# Patient Record
Sex: Female | Born: 1990 | Race: White | Hispanic: No | Smoking: Former smoker
Health system: Southern US, Community
[De-identification: ages and names within clinical notes are randomized; demographics above are authoritative.]

## PROBLEM LIST (undated history)

## (undated) ENCOUNTER — Inpatient Hospital Stay (HOSPITAL_COMMUNITY): Payer: Self-pay

## (undated) DIAGNOSIS — F329 Major depressive disorder, single episode, unspecified: Secondary | ICD-10-CM

## (undated) DIAGNOSIS — F32A Depression, unspecified: Secondary | ICD-10-CM

## (undated) DIAGNOSIS — Z789 Other specified health status: Secondary | ICD-10-CM

## (undated) HISTORY — DX: Depression, unspecified: F32.A

## (undated) HISTORY — DX: Major depressive disorder, single episode, unspecified: F32.9

## (undated) HISTORY — PX: NO PAST SURGERIES: SHX2092

---
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
## 2008-04-15 ENCOUNTER — Inpatient Hospital Stay (HOSPITAL_COMMUNITY): Admission: AD | Admit: 2008-04-15 | Discharge: 2008-04-16 | Payer: Self-pay | Admitting: Obstetrics and Gynecology

## 2008-05-01 ENCOUNTER — Inpatient Hospital Stay (HOSPITAL_COMMUNITY): Admission: AD | Admit: 2008-05-01 | Discharge: 2008-05-05 | Payer: Self-pay | Admitting: Obstetrics and Gynecology

## 2008-05-14 ENCOUNTER — Inpatient Hospital Stay (HOSPITAL_COMMUNITY): Admission: AD | Admit: 2008-05-14 | Discharge: 2008-05-16 | Payer: Self-pay | Admitting: Obstetrics and Gynecology

## 2009-08-14 ENCOUNTER — Emergency Department (HOSPITAL_COMMUNITY): Admission: EM | Admit: 2009-08-14 | Discharge: 2009-08-14 | Payer: Self-pay | Admitting: Emergency Medicine

## 2010-04-06 ENCOUNTER — Emergency Department (HOSPITAL_COMMUNITY): Admission: EM | Admit: 2010-04-06 | Discharge: 2010-04-06 | Payer: Self-pay | Admitting: Emergency Medicine

## 2010-05-09 ENCOUNTER — Inpatient Hospital Stay (HOSPITAL_COMMUNITY): Admission: AD | Admit: 2010-05-09 | Discharge: 2010-05-09 | Payer: Self-pay | Admitting: Obstetrics & Gynecology

## 2010-09-16 ENCOUNTER — Inpatient Hospital Stay (HOSPITAL_COMMUNITY): Admission: AD | Admit: 2010-09-16 | Discharge: 2010-09-16 | Payer: Self-pay | Admitting: Obstetrics & Gynecology

## 2010-09-16 ENCOUNTER — Ambulatory Visit: Payer: Self-pay | Admitting: Family Medicine

## 2010-09-17 ENCOUNTER — Ambulatory Visit: Payer: Self-pay | Admitting: Family Medicine

## 2010-09-17 ENCOUNTER — Inpatient Hospital Stay (HOSPITAL_COMMUNITY): Admission: AD | Admit: 2010-09-17 | Discharge: 2010-09-17 | Payer: Self-pay | Admitting: Obstetrics & Gynecology

## 2010-10-12 ENCOUNTER — Inpatient Hospital Stay (HOSPITAL_COMMUNITY)
Admission: AD | Admit: 2010-10-12 | Discharge: 2010-10-12 | Payer: Self-pay | Source: Home / Self Care | Admitting: Obstetrics & Gynecology

## 2010-10-15 ENCOUNTER — Ambulatory Visit: Payer: Self-pay | Admitting: Obstetrics & Gynecology

## 2010-10-16 ENCOUNTER — Ambulatory Visit: Payer: Self-pay | Admitting: Obstetrics and Gynecology

## 2010-10-16 ENCOUNTER — Encounter (INDEPENDENT_AMBULATORY_CARE_PROVIDER_SITE_OTHER): Payer: Self-pay | Admitting: *Deleted

## 2010-10-23 ENCOUNTER — Ambulatory Visit: Payer: Self-pay | Admitting: Obstetrics and Gynecology

## 2010-10-25 ENCOUNTER — Ambulatory Visit: Payer: Self-pay | Admitting: Obstetrics and Gynecology

## 2010-10-29 ENCOUNTER — Inpatient Hospital Stay (HOSPITAL_COMMUNITY)
Admission: RE | Admit: 2010-10-29 | Discharge: 2010-10-31 | Payer: Self-pay | Source: Home / Self Care | Admitting: Obstetrics & Gynecology

## 2010-11-27 ENCOUNTER — Ambulatory Visit: Payer: Self-pay | Admitting: Obstetrics and Gynecology

## 2010-12-04 ENCOUNTER — Ambulatory Visit: Payer: Self-pay | Admitting: Obstetrics and Gynecology

## 2011-02-16 ENCOUNTER — Emergency Department (HOSPITAL_COMMUNITY)
Admission: EM | Admit: 2011-02-16 | Discharge: 2011-02-16 | Disposition: A | Discharge status: Home / Self Care | Payer: Self-pay | Attending: Emergency Medicine | Admitting: Emergency Medicine

## 2011-02-16 DIAGNOSIS — G43909 Migraine, unspecified, not intractable, without status migrainosus: Secondary | ICD-10-CM | POA: Insufficient documentation

## 2011-02-16 LAB — URINE MICROSCOPIC-ADD ON

## 2011-02-16 LAB — URINALYSIS, ROUTINE W REFLEX MICROSCOPIC
Bilirubin Urine: NEGATIVE
Hgb urine dipstick: NEGATIVE
Ketones, ur: NEGATIVE mg/dL
Protein, ur: NEGATIVE mg/dL
Urobilinogen, UA: 0.2 mg/dL (ref 0.0–1.0)
pH: 5.5 (ref 5.0–8.0)

## 2011-02-18 LAB — POCT URINALYSIS DIPSTICK
Bilirubin Urine: NEGATIVE
Glucose, UA: NEGATIVE mg/dL
Hgb urine dipstick: NEGATIVE
Hgb urine dipstick: NEGATIVE
Ketones, ur: NEGATIVE mg/dL
Nitrite: NEGATIVE
Nitrite: NEGATIVE
Protein, ur: NEGATIVE mg/dL
Protein, ur: NEGATIVE mg/dL
Specific Gravity, Urine: 1.02 (ref 1.005–1.030)
Urobilinogen, UA: 0.2 mg/dL (ref 0.0–1.0)
Urobilinogen, UA: 0.2 mg/dL (ref 0.0–1.0)

## 2011-02-18 LAB — CBC
Hemoglobin: 11.9 g/dL — ABNORMAL LOW (ref 12.0–15.0)
MCV: 96.8 fL (ref 78.0–100.0)
RBC: 3.62 MIL/uL — ABNORMAL LOW (ref 3.87–5.11)
RDW: 14 % (ref 11.5–15.5)
WBC: 7.1 10*3/uL (ref 4.0–10.5)

## 2011-02-18 LAB — GLUCOSE, CAPILLARY: Glucose-Capillary: 69 mg/dL — ABNORMAL LOW (ref 70–99)

## 2011-02-18 LAB — URINALYSIS, ROUTINE W REFLEX MICROSCOPIC
Glucose, UA: 250 mg/dL — AB
Hgb urine dipstick: NEGATIVE
Protein, ur: NEGATIVE mg/dL

## 2011-02-20 LAB — WET PREP, GENITAL
Trich, Wet Prep: NONE SEEN
Yeast Wet Prep HPF POC: NONE SEEN

## 2011-02-20 LAB — GC/CHLAMYDIA PROBE AMP, GENITAL
Chlamydia, DNA Probe: NEGATIVE
GC Probe Amp, Genital: NEGATIVE

## 2011-02-20 LAB — URINALYSIS, ROUTINE W REFLEX MICROSCOPIC
Bilirubin Urine: NEGATIVE
Glucose, UA: NEGATIVE mg/dL
Hgb urine dipstick: NEGATIVE
Ketones, ur: NEGATIVE mg/dL
Specific Gravity, Urine: 1.015 (ref 1.005–1.030)

## 2011-02-20 LAB — DIFFERENTIAL
Basophils Absolute: 0 10*3/uL (ref 0.0–0.1)
Basophils Relative: 0 % (ref 0–1)
Eosinophils Absolute: 0 10*3/uL (ref 0.0–0.7)
Eosinophils Relative: 0 % (ref 0–5)
Neutro Abs: 4.5 10*3/uL (ref 1.7–7.7)

## 2011-02-20 LAB — STREP B DNA PROBE: Strep Group B Ag: NEGATIVE

## 2011-02-20 LAB — CBC: HCT: 33.1 % — ABNORMAL LOW (ref 36.0–46.0)

## 2011-02-20 LAB — RPR: RPR Ser Ql: NONREACTIVE

## 2011-02-20 LAB — HIV ANTIBODY (ROUTINE TESTING W REFLEX): HIV: NONREACTIVE

## 2011-02-24 LAB — URINALYSIS, ROUTINE W REFLEX MICROSCOPIC
Bilirubin Urine: NEGATIVE
Glucose, UA: NEGATIVE mg/dL
Ketones, ur: NEGATIVE mg/dL
Nitrite: NEGATIVE

## 2011-02-25 LAB — URINALYSIS, ROUTINE W REFLEX MICROSCOPIC
Bilirubin Urine: NEGATIVE
Glucose, UA: NEGATIVE mg/dL
Hgb urine dipstick: NEGATIVE
Ketones, ur: NEGATIVE mg/dL
Protein, ur: NEGATIVE mg/dL

## 2011-02-25 LAB — GC/CHLAMYDIA PROBE AMP, GENITAL
Chlamydia, DNA Probe: NEGATIVE
GC Probe Amp, Genital: NEGATIVE

## 2011-02-25 LAB — URINE MICROSCOPIC-ADD ON

## 2011-02-25 LAB — URINE CULTURE: Colony Count: >=100000

## 2011-02-25 LAB — WET PREP, GENITAL
Trich, Wet Prep: NONE SEEN
Yeast Wet Prep HPF POC: NONE SEEN

## 2011-04-22 NOTE — H&P (Signed)
NAME:  Carrie, Rogers NO.:  [id number]   MEDICAL RECORD NO.:  [id number]          PATIENT TYPE:  INP   LOCATION:  9164                          [facility]   PHYSICIAN:  Hal Morales, M.D.DATE OF BIRTH:  [birth year]   DATE OF ADMISSION:  05/01/2008  DATE OF DISCHARGE:                              HISTORY & PHYSICAL   HISTORY:  Carrie Rogers is a 21 year old gravida 1, para 33 and 2/7 weeks  who presented for induction secondary to post dates.  She reports  positive fetal movement.  Denies headache, visual symptoms or epigastric  pain.  Her pregnancy has been remarkable for  1. Group B strep negative.  2. Reports brother with multiple anomalies.  3. Late to care.  4. Irregular cycles.  5. Previous smoker.   PRENATAL LABS:  Blood type is O+, Rh antibody negative, VDRL  nonreactive, rubella titer positive, hepatitis B surface antigen  negative, HIV was nonreactive, toxoplasmosis titers were negative.  Pap  was normal.  GC and Chlamydia cultures were negative.  Hemoglobin upon  entering practice was 11.9.  It was 11.6 at 26 weeks.  The patient  presented too late for first trimester screen or quadruple screen.  A 1-  hour Glucola was elevated at 164, a 3-hour GTT was normal.  Group B  strep culture and other cultures were done at 36 weeks and were  negative.   HISTORY OF PRESENT PREGNANCY:  The patient entered care at approximately  22 weeks and 6 days.  EDC was uncertain, secondary to irregular cycles.  She had an earlier ultrasound with an EDC of Apr 22, 2008 she had  another at 32 weeks showing congruency of dating, weight at 85th  percentile, and normal AFI, placenta was posterior.  She had a Group B  strep cultures, GC, and Chlamydia cultures done at 36 weeks which were  negative.  She did have an elevated 1-hour Glucola of 164.  A 3-hour GTT  was normal.  She did plan [facility] for postpartum use.  A PUPPS rash  appeared at approximately 32 weeks, this  did resolve itself.  The rest  of her pregnancy was essentially uncomplicated.  On last exam she was  1+, 80% vertex at a minus one station.   OBSTETRICAL HISTORY:  The patient primigravida.   MEDICAL HISTORY:  1. She was previously on Ortho Tri-Cyclen and discontinued that in      September of 2008.  2. She reports the usual childhood illnesses.  3. She fractured her wrist at age 77.   SURGICAL HISTORY:  1. Includes wisdom teeth removed in 2008.  2. She was hospitalized for hypothermia at age 71.   ALLERGIES:  The patient has no known medication allergies.   FAMILY HISTORY:  Her father had an MI.  Her father had chronic  hypertension.  Paternal grandmother had varicosities.  Brothers and  cousins have asthma.  Her father is an insulin-dependent diabetic.  Her  father also had a stroke.  Paternal grandfather had lung cancer.  Maternal grandmother had breast cancer.  Cousins have depression.   GENETIC  HISTORY:  Remarkable for the patient's brother having multiple  deformities back and the hands.  The patient's mother had twin and  triplet pregnancy, but there was only 1 surviving on each.  Father of  baby's grandmother had twins.   SOCIAL HISTORY:  The patient is single.  The father of baby is not  currently present with her, today, but has been supportive.  Her parents  are with her today.  The patient has been a smoker 2-3 cigarettes per  day times 5-6 years.  She discontinued this in September of 2008.  Her  brother, sisters, aunts, and uncles are smokers.  Her brothers use  alcohol.  The patient has been followed by the certified nurse midwife  service in [place].  She denies any alcohol, drug, or  tobacco use during this pregnancy.  She is Caucasian.  She denies any  religious affiliation.  The father of baby's name is Alila Sotero.  The  patient has 2 years of college.  She is currently employed in [employer], and  is a Consulting civil engineer.  Her partner also has 3 years of  college.  He is also a  Consulting civil engineer.   PHYSICAL EXAM:  VITAL SIGNS: Stable.  The patient is febrile.  HEENT: Within normal limits.  LUNGS:  Breath sounds are clear.  HEART:  Regular rate and rhythm without murmur.  BREASTS:  Soft and nontender.  ABDOMEN:  Fundal height is approximately 39 cm. Estimated fetal weight  is 7-8 pounds.  Uterine contractions very occasional with some  irritability noted.  Fetal heart rate is reactive, initial baseline was  running 115-125; however, is now having reactive cycles with a somewhat  higher baseline in the 130s with much fetal activity.  CERVICAL EXAM:  Cervix is very posterior, a loose 1 cm, 80% vertex, at  minus one to a 0 station.  EXTREMITIES:  Deep tendon reflexes are 2+ without clonus.  There is  trace edema noted.   IMPRESSION:  1. Intrauterine pregnancy at 41-2/7 weeks.  2. Post dates.  3. Negative group B strep.   PLAN:  1. Admit to birthing suite with consult with Dr. Dierdre Forth as      attending physician.  2. Routine certified nurse midwife orders.  3. Will start with Pitocin induction then plan artificial rupture of      membranes as appropriate.  4. Patient will notify us regarding her desire for pain medication as      labor progresses.      Renaldo Reel Emilee Hero, C.N.M.      Hal Morales, M.D.  Electronically Signed    VLL/MEDQ  D:  05/01/2008  T:  05/01/2008  Job:  [id number]

## 2011-04-22 NOTE — H&P (Signed)
NAME:  CYRAH, MCLAMB NO.:  [id number]   MEDICAL RECORD NO.:  [id number]          PATIENT TYPE:  INP   LOCATION:  9306                          [facility]   PHYSICIAN:  Hal Morales, M.D.DATE OF BIRTH:  11/20/1991   DATE OF ADMISSION:  05/14/2008  DATE OF DISCHARGE:                              HISTORY & PHYSICAL   The patient is an 20 year old single white female, gravida 1, para 1,  status post a spontaneous vaginal delivery on May 03, 2008 (postpartum  day #11), who presents with chief complaint of fever and chills with  onset last night and malaise. Presented to primary care this evening and  sent directly here after a temperature there equal to 103 orally. T-max  at home per patient was 100.9 earlier in the day.  She complains of  slight nausea but no emesis, bowel movements normal, appetite decreased  this afternoon but normal intake at lunch. No UTI signs or symptoms or  PIH signs or symptoms. No cough or upper respiratory complaints.  She  does state, however, she has been winded when she has been walking  upstairs.  No redness or streaking on breast, cracking or blisters.  She reports good milk supply. No acute increase in vaginal bleeding.  She reports spotting recently, darker brown color. She last had Tylenol  this morning.  No medications since that time. She has a history of a 2-  day labor.  She was admitted on May 25 and delivered on May 27.  She  also has a history of postpartum hemorrhage after spontaneous delivery  of placenta.  Her T-max postpartum was 102.4, and she received three  doses of IV Unasyn while on mother baby.   PAST MEDICAL HISTORY:  She reports menarche at 35-16 years of age,  irregular cycles.  She reports use of Ortho-Cyclen in the past which she  had discontinued in September 2008. Smoking history:  She discontinued  in September 2008.  She previously smoked for 5-6 years, two to three  cigarettes per day. Fracture of  her wrist at age 45. Wisdom teeth  removal 2008. Hypothermia at age 37.   OBSTETRICAL HISTORY:  G1 with spontaneous vaginal delivery May 03, 2008,  of female by the name of Irving Burton.  She was 41-3/7 weeks. Baby weighed 9  pounds 2 ounces.  Apgars were 8/9. Significant for postpartum hemorrhage  for which she received Methalgen and Cytotec.  Has been breast feeding.  She had a second degree laceration which was repaired in normal fashion.  She did have some postpartum anemia.  Hemoglobin was 9.2 at discharge.  Patient without latex or medication allergies.   FAMILY HISTORY:  Father had a heart attack.  Father with high blood  pressure. Paternal grandmother varicosities. Brothers and some cousins  with asthma.  Father is an insulin-dependent diabetic. Father has had a  stroke.  Maternal grandfather lung cancer.  Maternal grandmother breast  cancer. Some cousins with depression. Brother, sisters, aunts, and  uncles nicotine addiction. Brothers alcohol.   SOCIAL HISTORY:  The patient is an 20 year old single white female.  No  religious affiliation. 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 She denied alcohol or illicit drug use during the  pregnancy and had discontinued cigarette use September 2008. Family is  very supportive.  She does live at home with family   OBJECTIVE:  VITAL SIGNS:  On admission, blood pressure 121/79, heart  rate 121, respirations 20, temperature 101.5 in triage.  Repeat  approximately well over an hour later and was 103.9 orally.   LABORATORY DATA:  CBC showed white count of 13.5.  Her hemoglobin was  11.9 which was 9.2 at discharge, hematocrit 34.3, platelets 303.  Her  differential of her CBC, neutrophils elevated equal to 90%., absolute  granulocytes 12.1.  UA showed large hemoglobin, 30 of protein, small  leukocytes.  Microscopic urine showed few bacteria, few epithelial  cells.   PHYSICAL EXAMINATION:  GENERAL:  She was in no  acute distress, alert and  oriented x3 and pleasant.  HEENT AND NECK:  No lymphedema. She does have glasses but otherwise  grossly intact.  Lips were slightly dry external but moist mucous  membranes.  SKIN:  Very warm but intact.  CARDIOVASCULAR: Tachycardic rate  but regular rhythm.  LUNGS:  Clear to auscultation bilaterally.  ABDOMEN:  Tender at umbilicus and lower quadrant.  She did grimace with  light palpation.  Uterus was -1.  No rebound or guarding.  PELVIC:  External inspection only. Laceration is healing well, well  approximated. No active lochia or vaginal bleeding with fundal pressure  and no edema external vulva.  EXTREMITIES:  Within normal limits and negative Homan's.   IMPRESSION:  1. Postpartum day #11 status post spontaneous vaginal delivery May 03, 2008, with a history of postpartum hemorrhage.  2. Probable endometritis.  3. Lactating   PLAN:  1. Consulted with Dr. Pennie Rushing regarding the patient's status, and plan      is to admit as inpatient for IV fluids and IV antibiotics.  2. She will receive clindamycin as well as gentamicin IV.  3. Prior to antibiotic initiation, blood cultures x2.  4 . She is to receive Tylenol 650 mg q.4 h p.o. x12 hours.  1. She is to have strict I's and O's.  2. Urine to be sent for culture and sensitivity.  3. Close observation for other signs and symptoms of infection and      temperature.      Candice [place], [place]      Hal Morales, M.D.  Electronically Signed    CHS/MEDQ  D:  05/14/2008  T:  05/14/2008  Job:  [id number]

## 2011-04-22 NOTE — Discharge Summary (Signed)
NAME:  Carrie Rogers, Carrie Rogers NO.:  [id number]   MEDICAL RECORD NO.:  [id number]          PATIENT TYPE:  INP   LOCATION:  9306                          [facility]   PHYSICIAN:  Hal Morales, M.D.DATE OF BIRTH:  04/30/1991   DATE OF ADMISSION:  05/14/2008  DATE OF DISCHARGE:  05/16/2008                               DISCHARGE SUMMARY   ADMISSION DIAGNOSES:  1. Status post vaginal delivery May 03, 2008, postpartum day #11.  2. Probable endometritis.  3. Breast-feeding.   DISCHARGE DIAGNOSES:  1. Status post vaginal delivery May 03, 2008, postpartum day #11.  2. Probable endometritis.  3. Breast-feeding.  4. Resolving endometritis.   HOSPITAL PROCEDURES:  IV antibiotics.   HOSPITAL COURSE:  The patient was admitted on May 14, 2008, with fever,  chills, and malaise with a temperature of 103 degrees.  She had slight  nausea, but no emesis.  Appetite was decreased and abdomen was tender.  White blood cell count on admission was 13.5 with 90% neutrophils.  She  was admitted as an inpatient for IV fluids and IV antibiotics.  Blood  cultures were drawn.  She was given Tylenol for her fever and urine was  sent for culture and sensitivity.  She was treated with gentamicin and  clindamycin for antibiotic coverage.  On May 15, 2008, she was doing  better.  Pain was decreased.  Lochia was normal.  She was afebrile.  Hemoglobin was 10.3.  White count was down to 10.6 with 80% neutrophils.  On May 16, 2008, she was ready to go home.  She was afebrile for 24  hours.  Abdomen was soft and nontender.  Chest, clear to auscultation.  Heart, regular rate and rhythm.  Extremities within normal limits.  Blood cultures showed no growth in either culture and she was deemed to  receive full benefit of her hospital stay and was discharged home.   DISCHARGE MEDICATIONS:  1. Augmentin 875 mg every 12 hours x10 days.  2. Motrin as needed per previous prescription.   DISCHARGE LABS:   Blood cultures negative x2.  White blood cell count  10.6, hemoglobin 10.3, and platelets 249.  Urine within normal limits.  Creatinine 0.57.  Urine culture pending.   DISCHARGE INSTRUCTIONS:  Include monitoring for any resumption of fever  or increase in pain or bleeding.   DISCHARGE FOLLOWUP:  In 1 week at [place] and then in July of  her postpartum visit.   CONDITION ON DISCHARGE:  Good.      Marie L. Williams, C.N.M.      Hal Morales, M.D.  Electronically Signed    MLW/MEDQ  D:  05/16/2008  T:  05/17/2008  Job:  [id number]

## 2011-04-25 NOTE — Discharge Summary (Signed)
NAME:  Carrie Rogers, BEITZ NO.:  [id number]   MEDICAL RECORD NO.:  [id number]          PATIENT TYPE:  INP   LOCATION:  9145                          [facility]   PHYSICIAN:  Crist Fat. Rivard, M.D. DATE OF BIRTH:  February 17, 1991   DATE OF ADMISSION:  05/01/2008  DATE OF DISCHARGE:  05/05/2008                               DISCHARGE SUMMARY   ADMITTING DIAGNOSES:  1. Intrauterine pregnancy at 22 and 2/7th weeks.  2. Post dates.   DISCHARGE DIAGNOSES:  1. Intrauterine pregnancy at 22 and 4/7th weeks.  2. Immediate postpartum hemorrhage.  3. Chorioamnionitis.  4. Postpartum anemia.   PROCEDURES:  1. Normal spontaneous vaginal birth.  2. Repair of second-degree perineal laceration.  3. Epidural anesthesia.   HOSPITAL COURSE:  Ms. Carrie Rogers is an 20 year old gravida 1, para 0, at 22  and 2/7th weeks' who presented for induction on 05/01/2008 secondary to  post dates.  Her pregnancy had been remarkable:  1. Group B strep negative.  2. Reports brother with multiple anomalies.  3. Late to care.  4. Irregular cycles.  5. Previous smoker.   On admission, the patient was started on Pitocin.  She was on Pitocin  throughout the day on 05/25, cervix at that time was 1+, 80% vertex at -  1 to 0 station with cervix very posterior.  By 7 p.m. that night, she  had not changed her cervix.  Plan was made to discontinue the Pitocin.  Cytotec was used through the night.  By the next morning, cervix was  still posterior to 80% vertex at a -1 station with bulging bag of water,  artificial rupture of membranes was accomplished after that.  Pitocin  was begun by approximately 1 p.m. when no change in labor status  occurred.  She had an epidural placed by that evening, at that time she  was 3.5 cm 90% vertex at -1 station.  Intrauterine pressure catheter was  placed.  By 10:30, __________ were still inadequate, temperature was  99.2.  By 1:15, she was 9 cm completely effaced with vertex  at 0 station  with no urge to push.  She labor down through the night.  She had some  sporadic decelerations.  She began pushing at approximately 4:30 a.m.,  vertex was at +2.  She then progressed to 2 hours of pushing, vertex was  at 2+ to +3 with progression being made.  She then was able to progress  to delivery at 8:30 a.m., although she was offered a vacuum assistance  at 7 a.m. and then 7:40 a.m., but she declined at that time.  At 8:30  a.m., she delivered a viable female by the name of Carrie Rogers, weight 9  pounds 2 ounces with Apgars were 8 and 9.  Placenta was spontaneous  intact.  She did have increased bleeding after placental delivery  occurred.  She received Methergine and 1000 mcg of Cytotec.  The  bleeding was isolated to a deep sulcus area and clamp was placed on a  vessel.  Dr. Estanislado Pandy was called to assist with exploration and possible  repair, but  there was no bleeding of significance noted at her  evaluation.  She found no cervical laceration and no deep cervical tear.  Second-degree perineal was repaired in the usual fashion.  The patient  did receive some Stadol postpartum.  The vaginal mucous was very  friable, but then this did resolve itself.  The patient's temperature  just prior to delivery was 100.4, it did spike to 102.4 at 11 a.m.,  Unasyn 3 g IV piggyback q.6 h. x3 doses was initiated.  The patient then  had an uncomplicated recovery course.  She had no dizziness or syncope.  Her hemoglobin on day 1 was 9.2, hematocrit was 26.2, white blood cell  count was 10, and platelet count was 142.  She had a temperature maximum  of 100.8 at noon on 05/27.  She was working on breast-feeding.  She did  complete the Unasyn course.  She was placed on iron.   On 05/05/2008, postpartum day #2, she was doing well.  She was up ad  lib.  She was tolerating ambulation with no difficulty.  She was having  a normal amount of vaginal bleeding and her vital signs were stable.  She  had been afebrile for greater than 24 hours.  She was deemed to  receive full benefit of hospital stay and was discharged home.   DISCHARGE INSTRUCTIONS:  Per [facility] handout.   DISCHARGE MEDICATIONS:  1. Motrin 600 mg p.o. q.6 h. p.r.n. pain.  2. She declined Tylox.  3. The patient will use Feosol 1 p.o. daily.  4. Stool softener 1 p.o. daily.   FOLLOWUP:  Discharge followup will occur in 6 weeks at [facility].       Renaldo Reel Emilee Hero, C.N.M.      Crist Fat Rivard, M.D.  Electronically Signed    VLL/MEDQ  D:  08/08/2008  T:  08/08/2008  Job:  [id number]

## 2011-09-03 LAB — CBC
HCT: 26.2 — ABNORMAL LOW
HCT: 35.2 — ABNORMAL LOW
Hemoglobin: 12.3
MCHC: 35.1
MCV: 91.9
MCV: 92.5
Platelets: 142 — ABNORMAL LOW
Platelets: 185
RDW: 12.5

## 2011-09-04 LAB — CBC
HCT: 29.6 — ABNORMAL LOW
Hemoglobin: 10.3 — ABNORMAL LOW
MCHC: 34.8
MCHC: 34.8
MCV: 92.8
MCV: 93.4
Platelets: 303
RBC: 3.17 — ABNORMAL LOW
RBC: 3.7 — ABNORMAL LOW
WBC: 10.6 — ABNORMAL HIGH

## 2011-09-04 LAB — URINALYSIS, ROUTINE W REFLEX MICROSCOPIC
Bilirubin Urine: NEGATIVE
Glucose, UA: NEGATIVE
Ketones, ur: NEGATIVE
Nitrite: NEGATIVE
Protein, ur: 30 — AB

## 2011-09-04 LAB — CREATININE, SERUM: GFR calc Af Amer: >60

## 2011-09-04 LAB — URINE MICROSCOPIC-ADD ON

## 2011-09-04 LAB — DIFFERENTIAL
Basophils Absolute: 0
Basophils Relative: 0
Basophils Relative: 0
Eosinophils Absolute: 0.1
Lymphocytes Relative: 15
Monocytes Absolute: 0.4
Monocytes Relative: 3
Monocytes Relative: 4
Neutro Abs: 12.1 — ABNORMAL HIGH
Neutro Abs: 8.4 — ABNORMAL HIGH
Neutrophils Relative %: 80 — ABNORMAL HIGH
Neutrophils Relative %: 90 — ABNORMAL HIGH

## 2011-09-04 LAB — URINE CULTURE: Colony Count: >=100000

## 2011-09-04 LAB — CULTURE, BLOOD (ROUTINE X 2): Culture: NO GROWTH

## 2012-06-01 ENCOUNTER — Inpatient Hospital Stay (HOSPITAL_COMMUNITY)
Admission: AD | Admit: 2012-06-01 | Discharge: 2012-06-01 | Disposition: A | Discharge status: Home / Self Care | Payer: Self-pay | Source: Ambulatory Visit | Attending: Obstetrics & Gynecology | Admitting: Obstetrics & Gynecology

## 2012-06-01 ENCOUNTER — Inpatient Hospital Stay (HOSPITAL_COMMUNITY): Payer: Self-pay

## 2012-06-01 ENCOUNTER — Encounter (HOSPITAL_COMMUNITY): Payer: Self-pay | Admitting: Advanced Practice Midwife

## 2012-06-01 DIAGNOSIS — O99891 Other specified diseases and conditions complicating pregnancy: Secondary | ICD-10-CM | POA: Insufficient documentation

## 2012-06-01 DIAGNOSIS — Z331 Pregnant state, incidental: Secondary | ICD-10-CM

## 2012-06-01 DIAGNOSIS — R1032 Left lower quadrant pain: Secondary | ICD-10-CM | POA: Insufficient documentation

## 2012-06-01 DIAGNOSIS — Z3490 Encounter for supervision of normal pregnancy, unspecified, unspecified trimester: Secondary | ICD-10-CM

## 2012-06-01 HISTORY — DX: Other specified health status: Z78.9

## 2012-06-01 LAB — URINALYSIS, ROUTINE W REFLEX MICROSCOPIC
Hgb urine dipstick: NEGATIVE
Nitrite: NEGATIVE
Specific Gravity, Urine: >1.03 — ABNORMAL HIGH (ref 1.005–1.030)
Urobilinogen, UA: 0.2 mg/dL (ref 0.0–1.0)
pH: 6 (ref 5.0–8.0)

## 2012-06-01 LAB — WET PREP, GENITAL: Clue Cells Wet Prep HPF POC: NONE SEEN

## 2012-06-01 LAB — URINE MICROSCOPIC-ADD ON

## 2012-06-01 LAB — POCT PREGNANCY, URINE: Preg Test, Ur: POSITIVE — AB

## 2012-06-01 NOTE — Discharge Instructions (Signed)
ABCs of Pregnancy A Antepartum care is very important. Be sure you see your doctor and get prenatal care as soon as you think you are pregnant. At this time, you will be tested for infection, genetic abnormalities and potential problems with you and the pregnancy. This is the time to discuss diet, exercise, work, medications, labor, pain medication during labor and the possibility of a cesarean delivery. Ask any questions that may concern you. It is important to see your doctor regularly throughout your pregnancy. Avoid exposure to toxic substances and chemicals - such as cleaning solvents, lead and mercury, some insecticides, and paint. Pregnant women should avoid exposure to paint fumes, and fumes that cause you to feel ill, dizzy or faint. When possible, it is a good idea to have a pre-pregnancy consultation with your caregiver to begin some important recommendations your caregiver suggests such as, taking folic acid, exercising, quitting smoking, avoiding alcoholic beverages, etc. B Breastfeeding is the healthiest choice for both you and your baby. It has many nutritional benefits for the baby and health benefits for the mother. It also creates a very tight and loving bond between the baby and mother. Talk to your doctor, your family and friends, and your employer about how you choose to feed your baby and how they can support you in your decision. Not all birth defects can be prevented, but a woman can take actions that may increase her chance of having a healthy baby. Many birth defects happen very early in pregnancy, sometimes before a woman even knows she is pregnant. Birth defects or abnormalities of any child in your or the father's family should be discussed with your caregiver. Get a good support bra as your breast size changes. Wear it especially when you exercise and when nursing.  C Celebrate the news of your pregnancy with the your spouse/father and family. Childbirth classes are helpful to  take for you and the spouse/father because it helps to understand what happens during the pregnancy, labor and delivery. Cesarean delivery should be discussed with your doctor so you are prepared for that possibility. The pros and cons of circumcision if it is a boy, should be discussed with your pediatrician. Cigarette smoking during pregnancy can result in low birth weight babies. It has been associated with infertility, miscarriages, tubal pregnancies, infant death (mortality) and poor health (morbidity) in childhood. Additionally, cigarette smoking may cause long-term learning disabilities. If you smoke, you should try to quit before getting pregnant and not smoke during the pregnancy. Secondary smoke may also harm a mother and her developing baby. It is a good idea to ask people to stop smoking around you during your pregnancy and after the baby is born. Extra calcium is necessary when you are pregnant and is found in your prenatal vitamin, in dairy products, green leafy vegetables and in calcium supplements. D A healthy diet according to your current weight and height, along with vitamins and mineral supplements should be discussed with your caregiver. Domestic abuse or violence should be made known to your doctor right away to get the situation corrected. Drink more water when you exercise to keep hydrated. Discomfort of your back and legs usually develops and progresses from the middle of the second trimester through to delivery of the baby. This is because of the enlarging baby and uterus, which may also affect your balance. Do not take illegal drugs. Illegal drugs can seriously harm the baby and you. Drink extra fluids (water is best) throughout pregnancy to help   your body keep up with the increases in your blood volume. Drink at least 6 to 8 glasses of water, fruit juice, or milk each day. A good way to know you are drinking enough fluid is when your urine looks almost like clear water or is very light  yellow.  E Eat healthy to get the nutrients you and your unborn baby need. Your meals should include the five basic food groups. Exercise (30 minutes of light to moderate exercise a day) is important and encouraged during pregnancy, if there are no medical problems or problems with the pregnancy. Exercise that causes discomfort or dizziness should be stopped and reported to your caregiver. Emotions during pregnancy can change from being ecstatic to depression and should be understood by you, your partner and your family. F Fetal screening with ultrasound, amniocentesis and monitoring during pregnancy and labor is common and sometimes necessary. Take 400 micrograms of folic acid daily both before, when possible, and during the first few months of pregnancy to reduce the risk of birth defects of the brain and spine. All women who could possibly become pregnant should take a vitamin with folic acid, every day. It is also important to eat a healthy diet with fortified foods (enriched grain products, including cereals, rice, breads, and pastas) and foods with natural sources of folate (orange juice, green leafy vegetables, beans, peanuts, broccoli, asparagus, peas, and lentils). The father should be involved with all aspects of the pregnancy including, the prenatal care, childbirth classes, labor, delivery, and postpartum time. Fathers may also have emotional concerns about being a father, financial needs, and raising a family. G Genetic testing should be done appropriately. It is important to know your family and the father's history. If there have been problems with pregnancies or birth defects in your family, report these to your doctor. Also, genetic counselors can talk with you about the information you might need in making decisions about having a family. You can call a major medical center in your area for help in finding a board-certified genetic counselor. Genetic testing and counseling should be done  before pregnancy when possible, especially if there is a history of problems in the mother's or father's family. Certain ethnic backgrounds are more at risk for genetic defects. H Get familiar with the hospital where you will be having your baby. Get to know how long it takes to get there, the labor and delivery area, and the hospital procedures. Be sure your medical insurance is accepted there. Get your home ready for the baby including, clothes, the baby's room (when possible), furniture and car seat. Hand washing is important throughout the day, especially after handling raw meat and poultry, changing the baby's diaper or using the bathroom. This can help prevent the spread of many bacteria and viruses that cause infection. Your hair may become dry and thinner, but will return to normal a few weeks after the baby is born. Heartburn is a common problem that can be treated by taking antacids recommended by your caregiver, eating smaller meals 5 or 6 times a day, not drinking liquids when eating, drinking between meals and raising the head of your bed 2 to 3 inches. I Insurance to cover you, the baby, doctor and hospital should be reviewed so that you will be prepared to pay any costs not covered by your insurance plan. If you do not have medical insurance, there are usually clinics and services available for you in your community. Take 30 milligrams of iron during   your pregnancy as prescribed by your doctor to reduce the risk of low red blood cells (anemia) later in pregnancy. All women of childbearing age should eat a diet rich in iron. J There should be a joint effort for the mother, father and any other children to adapt to the pregnancy financially, emotionally, and psychologically during the pregnancy. Join a support group for moms-to-be. Or, join a class on parenting or childbirth. Have the family participate when possible. K Know your limits. Let your caregiver know if you experience any of the  following:   Pain of any kind.   Strong cramps.   You develop a lot of weight in a short period of time (5 pounds in 3 to 5 days).   Vaginal bleeding, leaking of amniotic fluid.   Headache, vision problems.   Dizziness, fainting, shortness of breath.   Chest pain.   Fever of 102 F (38.9 C) or higher.   Gush of clear fluid from your vagina.   Painful urination.   Domestic violence.   Irregular heartbeat (palpitations).   Rapid beating of the heart (tachycardia).   Constant feeling sick to your stomach (nauseous) and vomiting.   Trouble walking, fluid retention (edema).   Muscle weakness.   If your baby has decreased activity.   Persistent diarrhea.   Abnormal vaginal discharge.   Uterine contractions at 20-minute intervals.   Back pain that travels down your leg.  L Learn and practice that what you eat and drink should be in moderation and healthy for you and your baby. Legal drugs such as alcohol and caffeine are important issues for pregnant women. There is no safe amount of alcohol a woman can drink while pregnant. Fetal alcohol syndrome, a disorder characterized by growth retardation, facial abnormalities, and central nervous system dysfunction, is caused by a woman's use of alcohol during pregnancy. Caffeine, found in tea, coffee, soft drinks and chocolate, should also be limited. Be sure to read labels when trying to cut down on caffeine during pregnancy. More than 200 foods, beverages, and over-the-counter medications contain caffeine and have a high salt content! There are coffees and teas that do not contain caffeine. M Medical conditions such as diabetes, epilepsy, and high blood pressure should be treated and kept under control before pregnancy when possible, but especially during pregnancy. Ask your caregiver about any medications that may need to be changed or adjusted during pregnancy. If you are currently taking any medications, ask your caregiver if it  is safe to take them while you are pregnant or before getting pregnant when possible. Also, be sure to discuss any herbs or vitamins you are taking. They are medicines, too! Discuss with your doctor all medications, prescribed and over-the-counter, that you are taking. During your prenatal visit, discuss the medications your doctor may give you during labor and delivery. N Never be afraid to ask your doctor or caregiver questions about your health, the progress of the pregnancy, family problems, stressful situations, and recommendation for a pediatrician, if you do not have one. It is better to take all precautions and discuss any questions or concerns you may have during your office visits. It is a good idea to write down your questions before you visit the doctor. O Over-the-counter cough and cold remedies may contain alcohol or other ingredients that should be avoided during pregnancy. Ask your caregiver about prescription, herbs or over-the-counter medications that you are taking or may consider taking while pregnant.  P Physical activity during pregnancy can   benefit both you and your baby by lessening discomfort and fatigue, providing a sense of well-being, and increasing the likelihood of early recovery after delivery. Light to moderate exercise during pregnancy strengthens the belly (abdominal) and back muscles. This helps improve posture. Practicing yoga, walking, swimming, and cycling on a stationary bicycle are usually safe exercises for pregnant women. Avoid scuba diving, exercise at high altitudes (over 3000 feet), skiing, horseback riding, contact sports, etc. Always check with your doctor before beginning any kind of exercise, especially during pregnancy and especially if you did not exercise before getting pregnant. Q Queasiness, stomach upset and morning sickness are common during pregnancy. Eating a couple of crackers or dry toast before getting out of bed. Foods that you normally love may  make you feel sick to your stomach. You may need to substitute other nutritious foods. Eating 5 or 6 small meals a day instead of 3 large ones may make you feel better. Do not drink with your meals, drink between meals. Questions that you have should be written down and asked during your prenatal visits. R Read about and make plans to baby-proof your home. There are important tips for making your home a safer environment for your baby. Review the tips and make your home safer for you and your baby. Read food labels regarding calories, salt and fat content in the food. S Saunas, hot tubs, and steam rooms should be avoided while you are pregnant. Excessive high heat may be harmful during your pregnancy. Your caregiver will screen and examine you for sexually transmitted diseases and genetic disorders during your prenatal visits. Learn the signs of labor. Sexual relations while pregnant is safe unless there is a medical or pregnancy problem and your caregiver advises against it. T Traveling long distances should be avoided especially in the third trimester of your pregnancy. If you do have to travel out of state, be sure to take a copy of your medical records and medical insurance plan with you. You should not travel long distances without seeing your doctor first. Most airlines will not allow you to travel after 36 weeks of pregnancy. Toxoplasmosis is an infection caused by a parasite that can seriously harm an unborn baby. Avoid eating undercooked meat and handling cat litter. Be sure to wear gloves when gardening. Tingling of the hands and fingers is not unusual and is due to fluid retention. This will go away after the baby is born. U Womb (uterus) size increases during the first trimester. Your kidneys will begin to function more efficiently. This may cause you to feel the need to urinate more often. You may also leak urine when sneezing, coughing or laughing. This is due to the growing uterus pressing  against your bladder, which lies directly in front of and slightly under the uterus during the first few months of pregnancy. If you experience burning along with frequency of urination or bloody urine, be sure to tell your doctor. The size of your uterus in the third trimester may cause a problem with your balance. It is advisable to maintain good posture and avoid wearing high heels during this time. An ultrasound of your baby may be necessary during your pregnancy and is safe for you and your baby. V Vaccinations are an important concern for pregnant women. Get needed vaccines before pregnancy. Center for Disease Control (www.cdc.gov) has clear guidelines for the use of vaccines during pregnancy. Review the list, be sure to discuss it with your doctor. Prenatal vitamins are helpful   and healthy for you and the baby. Do not take extra vitamins except what is recommended. Taking too much of certain vitamins can cause overdose problems. Continuous vomiting should be reported to your caregiver. Varicose veins may appear especially if there is a family history of varicose veins. They should subside after the delivery of the baby. Support hose helps if there is leg discomfort. W Being overweight or underweight during pregnancy may cause problems. Try to get within 15 pounds of your ideal weight before pregnancy. Remember, pregnancy is not a time to be dieting! Do not stop eating or start skipping meals as your weight increases. Both you and your baby need the calories and nutrition you receive from a healthy diet. Be sure to consult with your doctor about your diet. There is a formula and diet plan available depending on whether you are overweight or underweight. Your caregiver or nutritionist can help and advise you if necessary. X Avoid X-rays. If you must have dental work or diagnostic tests, tell your dentist or physician that you are pregnant so that extra care can be taken. X-rays should only be taken when  the risks of not taking them outweigh the risk of taking them. If needed, only the minimum amount of radiation should be used. When X-rays are necessary, protective lead shields should be used to cover areas of the body that are not being X-rayed. Y Your baby loves you. Breastfeeding your baby creates a loving and very close bond between the two of you. Give your baby a healthy environment to live in while you are pregnant. Infants and children require constant care and guidance. Their health and safety should be carefully watched at all times. After the baby is born, rest or take a nap when the baby is sleeping. Z Get your ZZZs. Be sure to get plenty of rest. Resting on your side as often as possible, especially on your left side is advised. It provides the best circulation to your baby and helps reduce swelling. Try taking a nap for 30 to 45 minutes in the afternoon when possible. After the baby is born rest or take a nap when the baby is sleeping. Try elevating your feet for that amount of time when possible. It helps the circulation in your legs and helps reduce swelling.  Most information courtesy of the CDC. Document Released: 11/24/2005 Document Revised: 11/13/2011 Document Reviewed: 08/08/2009 [employer] Patient Information 2012 [employer]. 

## 2012-06-01 NOTE — MAU Provider Note (Signed)
History     CSN: [id number]  Arrival date and time: 06/01/12 2035   First Provider Initiated Contact with Patient 06/01/12 2207      Chief Complaint  Patient presents with  . Possible Pregnancy   HPI This is a 21 y.o. female who presents at early gestation for evaluation and "checkup".  States started having LLQ pain this afternoon, initially sharp then dull. No bleeding. LMP in March, but no + UPTs until this month.   OB History    Grav Para Term Preterm Abortions TAB SAB Ect Mult Living   2 2 2       2       Past Medical History  Diagnosis Date  . No pertinent past medical history     Past Surgical History  Procedure Date  . No past surgeries     Family History  Problem Relation Age of Onset  . Other Neg Hx     History  Substance Use Topics  . Smoking status: Former Smoker -- 0.2 packs/day  . Smokeless tobacco: Former Neurosurgeon    Quit date: 04/01/2012  . Alcohol Use: No    Allergies: No Known Allergies  Prescriptions prior to admission  Medication Sig Dispense Refill  . Prenatal Vit-Fe Fumarate-FA (PRENATAL MULTIVITAMIN) TABS Take 1 tablet by mouth at bedtime.        ROS As listed in HPI  Physical Exam   Last menstrual period 02/20/2012.  Physical Exam  Constitutional: She appears well-developed and well-nourished. No distress.  HENT:  Head: Normocephalic.  Cardiovascular: Normal rate.   Respiratory: Effort normal.  GI: Soft. She exhibits no distension and no mass. There is tenderness (mild over LLQ). There is no rebound and no guarding.  Genitourinary: Vagina normal and uterus normal. No vaginal discharge found.       Uterus nontender, about 8 wks size Adnexae nontender bilaterally  Cervix long and closed    MAU Course  Procedures  MDM Will check UA and [place]  >> US Ob Comp Less 14 Wks  06/01/2012  *RADIOLOGY REPORT*  Clinical Data: Left lower quadrant pain.  Uncertain dates. Estimated gestational age by LMP is 14 weeks 4 days.  No quantitative  beta HCG reported.  OBSTETRIC <14 WK ULTRASOUND  Technique:  Transabdominal ultrasound was performed for evaluation of the gestation as well as the maternal uterus and adnexal regions.  Comparison:  None.  Intrauterine gestational sac: A single intrauterine gestational sac is visualized with normal shape and appearance. Yolk sac: A yolk sac is visualized. Embryo: Fetal pole is visualized. Cardiac Activity: Fetal cardiac activity is visualized. Heart Rate: 172 bpm  CRL:  25 mm  9w  2d           [place] EDC: 01/02/2013  Maternal uterus/Adnexae: Myometrial mass lesions identified.  No subchorionic hemorrhage. The ovaries appear symmetrical.  No abnormal adnexal masses.  No free pelvic fluid collections.  IMPRESSION: A single intrauterine pregnancy.  Estimated gestational age by crown-rump length is 9 weeks 2 days.  Original Report Authenticated By: Marlon Pel, M.D.  Results for orders placed during the hospital encounter of 06/01/12 (from the past 24 hour(s))  URINALYSIS, ROUTINE W REFLEX MICROSCOPIC     Status: Abnormal   Collection Time   06/01/12  9:10 PM      Component Value Range   Color, Urine YELLOW  YELLOW   APPearance CLEAR  CLEAR   Specific Gravity, Urine >1.030 (*) 1.005 - 1.030   pH 6.0  5.0 - 8.0   Glucose, UA NEGATIVE  NEGATIVE mg/dL   Hgb urine dipstick NEGATIVE  NEGATIVE   Bilirubin Urine NEGATIVE  NEGATIVE   Ketones, ur NEGATIVE  NEGATIVE mg/dL   Protein, ur NEGATIVE  NEGATIVE mg/dL   Urobilinogen, UA 0.2  0.0 - 1.0 mg/dL   Nitrite NEGATIVE  NEGATIVE   Leukocytes, UA TRACE (*) NEGATIVE  URINE MICROSCOPIC-ADD ON     Status: Normal   Collection Time   06/01/12  9:10 PM      Component Value Range   Squamous Epithelial / LPF RARE  RARE   WBC, UA 0-2  <3 WBC/hpf   RBC / HPF 0-2  <3 RBC/hpf  POCT PREGNANCY, URINE     Status: Abnormal   Collection Time   06/01/12  9:22 PM      Component Value Range   Preg Test, Ur POSITIVE (*) NEGATIVE  WET PREP, GENITAL     Status: Abnormal    Collection Time   06/01/12 10:06 PM      Component Value Range   Yeast Wet Prep HPF POC NONE SEEN  NONE SEEN   Trich, Wet Prep NONE SEEN  NONE SEEN   Clue Cells Wet Prep HPF POC NONE SEEN  NONE SEEN   WBC, Wet Prep HPF POC FEW (*) NONE SEEN     Assessment and Plan  A:  SIUP at 9.2 weeks by [place]      Normal labs      Cultures pending P:  Congratulated patient      Discharge home       Encouraged to seek [facility]  [facility] 06/01/2012, 10:15 PM

## 2012-06-01 NOTE — MAU Note (Signed)
Pt reports she has not had a period since March. Had a discharge in April and no period in [place]. Positive pregnancy test last week.

## 2012-06-02 ENCOUNTER — Encounter (HOSPITAL_COMMUNITY): Payer: Self-pay | Admitting: Advanced Practice Midwife

## 2012-06-02 NOTE — MAU Provider Note (Signed)
Attestation of Attending Supervision of Advanced Practitioner (CNM/NP): Evaluation and management procedures were performed by the Advanced Practitioner under my supervision and collaboration.  I have reviewed the Advanced Practitioner's note and chart, and I agree with the management and plan.  Jaynie Collins, M.D. 06/02/2012 7:42 AM

## 2012-06-03 LAB — GC/CHLAMYDIA PROBE AMP, GENITAL: GC Probe Amp, Genital: NEGATIVE

## 2012-09-15 ENCOUNTER — Ambulatory Visit (INDEPENDENT_AMBULATORY_CARE_PROVIDER_SITE_OTHER): Payer: Self-pay | Admitting: Advanced Practice Midwife

## 2012-09-15 ENCOUNTER — Encounter: Payer: Self-pay | Admitting: *Deleted

## 2012-09-15 ENCOUNTER — Encounter: Payer: Self-pay | Admitting: Advanced Practice Midwife

## 2012-09-15 VITALS — BP 117/72 | Temp 97.9°F | Ht 63.0 in | Wt 173.2 lb

## 2012-09-15 DIAGNOSIS — O0930 Supervision of pregnancy with insufficient antenatal care, unspecified trimester: Secondary | ICD-10-CM | POA: Insufficient documentation

## 2012-09-15 DIAGNOSIS — Z3480 Encounter for supervision of other normal pregnancy, unspecified trimester: Secondary | ICD-10-CM

## 2012-09-15 LAB — POCT URINALYSIS DIP (DEVICE)
Bilirubin Urine: NEGATIVE
Ketones, ur: NEGATIVE mg/dL
Protein, ur: NEGATIVE mg/dL
Specific Gravity, Urine: 1.015 (ref 1.005–1.030)

## 2012-09-15 NOTE — Progress Notes (Signed)
   Subjective:    Carrie Rogers is a [id number] [redacted]w[redacted]d being seen today for her first obstetrical visit.  Her obstetrical history is significant for NSVD x2 at 41 weeks. Patient does intend to breast feed. Pregnancy history fully reviewed.  Patient reports no complaints.  Filed Vitals:   09/15/12 0954 09/15/12 0958  BP: 117/72   Temp: 97.9 F (36.6 C)   Height:  5\' 3"  (1.6 m)  Weight: 78.563 kg (173 lb 3.2 oz)     HISTORY: OB History    Grav Para Term Preterm Abortions TAB SAB Ect Mult Living   4 2 2       2      # Outc Date GA Lbr Len/2nd Wgt Sex Del Anes PTL Lv   1 TRM 2009 [redacted]w[redacted]d   F SVD  No    Comments: PPH, Postpartum Endometritis   2 TRM 2011 [redacted]w[redacted]d   M SVD  No    3 CUR            4 GRA              Past Medical History  Diagnosis Date  . No pertinent past medical history   . Depression    Past Surgical History  Procedure Date  . No past surgeries    Family History  Problem Relation Age of Onset  . Other Neg Hx   . Diabetes Father   . Heart disease Father      Exam    Uterus:     Pelvic Exam:    Perineum: No Hemorrhoids, perineum normal   Vulva: normal   Vagina:  normal mucosa, wet prep done, large amount yellow/white discharge   pH:    Cervix: multiparous appearance, no bleeding following Pap, no cervical motion tenderness and no lesions   Adnexa: not evaluated   Bony Pelvis: average  System: Breast:  normal appearance, no masses or tenderness   Skin: normal coloration and turgor, no rashes    Neurologic: oriented, normal, normal mood, gait normal; reflexes normal and symmetric   Extremities: normal strength, tone, and muscle mass   HEENT PERRLA   Mouth/Teeth mucous membranes moist, pharynx normal without lesions   Neck supple and no masses   Cardiovascular: regular rate and rhythm, no murmurs or gallops   Respiratory:  appears well, vitals normal, no respiratory distress, acyanotic, normal RR, ear and throat exam is normal, neck free of mass or  lymphadenopathy, chest clear, no wheezing, crepitations, rhonchi, normal symmetric air entry   Abdomen: soft, non-tender; bowel sounds normal; no masses,  no organomegaly   Urinary: urethral meatus normal      Assessment:    Pregnancy: G4P2002 1. Supervision of normal subsequent pregnancy         Plan:     Initial labs drawn. Prenatal vitamins. Problem list reviewed and updated. Urine culture sent Wet prep collected Genetic Screening discussed. Late to care.: declined.  Ultrasound discussed; fetal survey: ordered.  Follow up in 4 weeks. 50% of 30 min visit spent on counseling and coordination of care.     Rogers, Carrie Leinweber 09/15/2012

## 2012-09-15 NOTE — Addendum Note (Signed)
Addended by: Sharen Counter A on: 09/15/2012 12:40 PM   Modules accepted: Level of Service, SmartSet

## 2012-09-15 NOTE — Progress Notes (Signed)
Pulse 100.   Edema trace in feet. No pain; high pressure in pelvic. Vaginal d/c as creamy mucous; no itch, no odor.

## 2012-09-16 LAB — OBSTETRIC PANEL
Antibody Screen: NEGATIVE
Basophils Absolute: 0 10*3/uL (ref 0.0–0.1)
Eosinophils Relative: 1 % (ref 0–5)
HCT: 38.5 % (ref 36.0–46.0)
Hemoglobin: 13.7 g/dL (ref 12.0–15.0)
Lymphocytes Relative: 26 % (ref 12–46)
Lymphs Abs: 2.1 10*3/uL (ref 0.7–4.0)
MCV: 89.7 fL (ref 78.0–100.0)
Monocytes Absolute: 0.4 10*3/uL (ref 0.1–1.0)
Monocytes Relative: 5 % (ref 3–12)
Neutro Abs: 5.4 10*3/uL (ref 1.7–7.7)
RBC: 4.29 MIL/uL (ref 3.87–5.11)
RDW: 13.1 % (ref 11.5–15.5)
Rh Type: POSITIVE
Rubella: 51.9 IU/mL — ABNORMAL HIGH
WBC: 7.9 10*3/uL (ref 4.0–10.5)

## 2012-09-16 LAB — WET PREP, GENITAL: Yeast Wet Prep HPF POC: NONE SEEN

## 2012-09-17 ENCOUNTER — Other Ambulatory Visit: Payer: Self-pay | Admitting: Advanced Practice Midwife

## 2012-09-17 MED ORDER — NITROFURANTOIN MONOHYD MACRO 100 MG PO CAPS: 100.0000 mg | ORAL_CAPSULE | Freq: Two times a day (BID) | ORAL | Status: DC

## 2012-09-17 NOTE — Progress Notes (Signed)
Urine culture positive, Macrobid 100 mg BID x 7 days sent to pharmacy.

## 2012-09-18 LAB — CULTURE, OB URINE

## 2012-09-20 ENCOUNTER — Telehealth: Payer: Self-pay | Admitting: *Deleted

## 2012-09-20 ENCOUNTER — Ambulatory Visit (HOSPITAL_COMMUNITY)
Admission: RE | Admit: 2012-09-20 | Discharge: 2012-09-20 | Disposition: A | Discharge status: Home / Self Care | Payer: Medicaid Other | Source: Ambulatory Visit | Attending: Advanced Practice Midwife | Admitting: Advanced Practice Midwife

## 2012-09-20 DIAGNOSIS — Z3480 Encounter for supervision of other normal pregnancy, unspecified trimester: Secondary | ICD-10-CM

## 2012-09-20 DIAGNOSIS — Z1389 Encounter for screening for other disorder: Secondary | ICD-10-CM | POA: Insufficient documentation

## 2012-09-20 DIAGNOSIS — Z363 Encounter for antenatal screening for malformations: Secondary | ICD-10-CM | POA: Insufficient documentation

## 2012-09-20 DIAGNOSIS — O358XX0 Maternal care for other (suspected) fetal abnormality and damage, not applicable or unspecified: Secondary | ICD-10-CM | POA: Insufficient documentation

## 2012-09-20 NOTE — Telephone Encounter (Signed)
Called [place] and informed her of +UTI and antibiotic sent to [facility] on [address]. Patient voices understanding.

## 2012-09-20 NOTE — Telephone Encounter (Signed)
Message copied by Gerome Apley on Mon Sep 20, 2012  9:16 AM ------      Message from: Sharen Counter A      Created: Fri Sep 17, 2012 12:33 PM       Urine culture positive, Macrobid 100 mg BID x 7 days sent to pharmacy. Please call pt to let her know.  Thank you.

## 2012-10-06 ENCOUNTER — Ambulatory Visit (INDEPENDENT_AMBULATORY_CARE_PROVIDER_SITE_OTHER): Payer: Medicaid Other | Admitting: Advanced Practice Midwife

## 2012-10-06 VITALS — BP 111/64 | Temp 97.4°F | Wt 174.0 lb

## 2012-10-06 DIAGNOSIS — O0930 Supervision of pregnancy with insufficient antenatal care, unspecified trimester: Secondary | ICD-10-CM

## 2012-10-06 DIAGNOSIS — Z23 Encounter for immunization: Secondary | ICD-10-CM

## 2012-10-06 DIAGNOSIS — Z3480 Encounter for supervision of other normal pregnancy, unspecified trimester: Secondary | ICD-10-CM | POA: Insufficient documentation

## 2012-10-06 LAB — POCT URINALYSIS DIP (DEVICE)
Bilirubin Urine: NEGATIVE
Glucose, UA: NEGATIVE mg/dL
Hgb urine dipstick: NEGATIVE
Specific Gravity, Urine: 1.015 (ref 1.005–1.030)
Urobilinogen, UA: 1 mg/dL (ref 0.0–1.0)

## 2012-10-06 MED ORDER — INFLUENZA VIRUS VACC SPLIT PF IM SUSP
0.5000 mL | Freq: Once | INTRAMUSCULAR | Status: AC
  Administered 2012-10-06: 0.5 mL via INTRAMUSCULAR

## 2012-10-06 NOTE — Progress Notes (Signed)
Pulse 89 Edema trace in feet. C/o pressure in pelvic; no pain. Vaginal d/c as mucous clear; no itch, no odor.

## 2012-10-06 NOTE — Progress Notes (Signed)
Doing well.  Good fetal movement, denies vaginal bleeding, LOF, regular contractions.  1 hour GTT and 28 week labs today.  Flu shot given. Insufficient views during detail sono, otherwise normal.  F/U sono ordered.

## 2012-10-07 LAB — GLUCOSE TOLERANCE, 1 HOUR (50G) W/O FASTING: Glucose, 1 Hour GTT: 146 mg/dL — ABNORMAL HIGH (ref 70–140)

## 2012-10-08 ENCOUNTER — Telehealth: Payer: Self-pay | Admitting: *Deleted

## 2012-10-08 NOTE — Telephone Encounter (Signed)
Pt left message stating that she is expecting a call with information about an ultrasound appointment. I returned pt's call this morning and scheduled [place] for 11/13 @ 0845.  Pt agreed and voiced understanding.

## 2012-10-11 ENCOUNTER — Other Ambulatory Visit: Payer: Medicaid Other

## 2012-10-11 DIAGNOSIS — R7309 Other abnormal glucose: Secondary | ICD-10-CM

## 2012-10-12 LAB — GLUCOSE TOLERANCE, 3 HOURS
Glucose Tolerance, 1 hour: 173 mg/dL (ref 70–189)
Glucose Tolerance, Fasting: 78 mg/dL (ref 70–104)
Glucose, GTT - 3 Hour: 104 mg/dL (ref 70–144)

## 2012-10-20 ENCOUNTER — Ambulatory Visit (HOSPITAL_COMMUNITY)
Admission: RE | Admit: 2012-10-20 | Discharge: 2012-10-20 | Disposition: A | Discharge status: Home / Self Care | Payer: Medicaid Other | Source: Ambulatory Visit | Attending: Advanced Practice Midwife | Admitting: Advanced Practice Midwife

## 2012-10-20 ENCOUNTER — Encounter: Payer: Self-pay | Admitting: Family

## 2012-10-20 ENCOUNTER — Ambulatory Visit (INDEPENDENT_AMBULATORY_CARE_PROVIDER_SITE_OTHER): Payer: Medicaid Other | Admitting: Family

## 2012-10-20 VITALS — BP 107/70 | Temp 98.0°F | Wt 176.4 lb

## 2012-10-20 DIAGNOSIS — O0930 Supervision of pregnancy with insufficient antenatal care, unspecified trimester: Secondary | ICD-10-CM

## 2012-10-20 DIAGNOSIS — Z3689 Encounter for other specified antenatal screening: Secondary | ICD-10-CM | POA: Insufficient documentation

## 2012-10-20 DIAGNOSIS — Z3480 Encounter for supervision of other normal pregnancy, unspecified trimester: Secondary | ICD-10-CM

## 2012-10-20 LAB — POCT URINALYSIS DIP (DEVICE)
Hgb urine dipstick: NEGATIVE
Nitrite: POSITIVE — AB
Protein, ur: NEGATIVE mg/dL
Specific Gravity, Urine: 1.015 (ref 1.005–1.030)
Urobilinogen, UA: 0.2 mg/dL (ref 0.0–1.0)

## 2012-10-20 MED ORDER — CEPHALEXIN 500 MG PO CAPS: 500.0000 mg | ORAL_CAPSULE | Freq: Two times a day (BID) | ORAL | Status: DC

## 2012-10-20 NOTE — Progress Notes (Signed)
Pulse: 93  Has pelvic pressure and back pain.

## 2012-10-20 NOTE — Progress Notes (Signed)
No problems or concerns; HIV screen today. RX Keflex for nitrites in urine.  Send culture.

## 2012-10-21 LAB — HIV ANTIBODY (ROUTINE TESTING W REFLEX): HIV: NONREACTIVE

## 2012-11-03 ENCOUNTER — Ambulatory Visit (INDEPENDENT_AMBULATORY_CARE_PROVIDER_SITE_OTHER): Payer: Medicaid Other | Admitting: Advanced Practice Midwife

## 2012-11-03 ENCOUNTER — Other Ambulatory Visit (HOSPITAL_COMMUNITY)
Admission: RE | Admit: 2012-11-03 | Discharge: 2012-11-03 | Disposition: A | Discharge status: Home / Self Care | Payer: Medicaid Other | Source: Ambulatory Visit | Attending: Advanced Practice Midwife | Admitting: Advanced Practice Midwife

## 2012-11-03 VITALS — BP 109/69 | Temp 96.6°F | Wt 177.0 lb

## 2012-11-03 DIAGNOSIS — O9989 Other specified diseases and conditions complicating pregnancy, childbirth and the puerperium: Secondary | ICD-10-CM

## 2012-11-03 DIAGNOSIS — N760 Acute vaginitis: Secondary | ICD-10-CM | POA: Insufficient documentation

## 2012-11-03 DIAGNOSIS — Z23 Encounter for immunization: Secondary | ICD-10-CM

## 2012-11-03 DIAGNOSIS — M62830 Muscle spasm of back: Secondary | ICD-10-CM

## 2012-11-03 DIAGNOSIS — N898 Other specified noninflammatory disorders of vagina: Secondary | ICD-10-CM

## 2012-11-03 DIAGNOSIS — N390 Urinary tract infection, site not specified: Secondary | ICD-10-CM

## 2012-11-03 DIAGNOSIS — O2390 Unspecified genitourinary tract infection in pregnancy, unspecified trimester: Secondary | ICD-10-CM

## 2012-11-03 DIAGNOSIS — M5380 Other specified dorsopathies, site unspecified: Secondary | ICD-10-CM

## 2012-11-03 DIAGNOSIS — Z3480 Encounter for supervision of other normal pregnancy, unspecified trimester: Secondary | ICD-10-CM

## 2012-11-03 DIAGNOSIS — O2343 Unspecified infection of urinary tract in pregnancy, third trimester: Secondary | ICD-10-CM

## 2012-11-03 DIAGNOSIS — O0930 Supervision of pregnancy with insufficient antenatal care, unspecified trimester: Secondary | ICD-10-CM

## 2012-11-03 LAB — POCT URINALYSIS DIP (DEVICE)
Bilirubin Urine: NEGATIVE
Glucose, UA: NEGATIVE mg/dL
Hgb urine dipstick: NEGATIVE
Specific Gravity, Urine: 1.015 (ref 1.005–1.030)
pH: 8.5 — ABNORMAL HIGH (ref 5.0–8.0)

## 2012-11-03 MED ORDER — TETANUS-DIPHTH-ACELL PERTUSSIS 5-2.5-18.5 LF-MCG/0.5 IM SUSP
0.5000 mL | Freq: Once | INTRAMUSCULAR | Status: AC
  Administered 2012-11-03: 0.5 mL via INTRAMUSCULAR

## 2012-11-03 MED ORDER — CYCLOBENZAPRINE HCL 10 MG PO TABS: 5.0000 mg | ORAL_TABLET | Freq: Three times a day (TID) | ORAL | Status: DC | PRN

## 2012-11-03 NOTE — Progress Notes (Signed)
Pulse 100. Edema trace in feet.  C/o lower back and pelvic pain; pressure in pelvic. Vaginal d/c stated as clear mucous- no odor; no itch.

## 2012-11-03 NOTE — Progress Notes (Signed)
E. Coli UTI Tx w/ Keflex. Never felt symptomatic, but reports ongoing low abd pressure, new onset of intermittent LBP at night, ?assiciated w/ UC's. Interrupting sleep. Will send Urine TOC. fFN and Wet prep sent. Flexeril. Rec increase fluids. Warm bath before bed. PTL precautions.

## 2012-11-05 LAB — CULTURE, OB URINE: Colony Count: >=100000

## 2012-11-16 ENCOUNTER — Other Ambulatory Visit: Payer: Self-pay | Admitting: Advanced Practice Midwife

## 2012-11-16 DIAGNOSIS — O2340 Unspecified infection of urinary tract in pregnancy, unspecified trimester: Secondary | ICD-10-CM

## 2012-11-16 MED ORDER — NITROFURANTOIN MONOHYD MACRO 100 MG PO CAPS: 100.0000 mg | ORAL_CAPSULE | Freq: Every day | ORAL | Status: DC

## 2012-11-16 MED ORDER — AMOXICILLIN-POT CLAVULANATE 875-125 MG PO TABS: 1.0000 | ORAL_TABLET | Freq: Two times a day (BID) | ORAL | Status: DC

## 2012-11-16 NOTE — Progress Notes (Signed)
Urine culture E. coli pos third time in 2 months, pan-sensitive. Took Macrobid, then Keflex 500 BID x 7 days (too low dose?). Will Rx Augmentin 500 mg  BID x 10 days, then Macrobid 100 Mg QD for prophylaxis for remainder of pregnancy. Will need urology F/U if TOC still pos to eval for possible structural abnormality.

## 2012-11-17 ENCOUNTER — Encounter (HOSPITAL_COMMUNITY): Payer: Self-pay | Admitting: *Deleted

## 2012-11-17 ENCOUNTER — Ambulatory Visit (HOSPITAL_COMMUNITY)
Admission: RE | Admit: 2012-11-17 | Discharge: 2012-11-17 | Disposition: A | Discharge status: Home / Self Care | Payer: Medicaid Other | Source: Ambulatory Visit | Attending: Obstetrics and Gynecology | Admitting: Obstetrics and Gynecology

## 2012-11-17 ENCOUNTER — Inpatient Hospital Stay (HOSPITAL_COMMUNITY)
Admission: AD | Admit: 2012-11-17 | Discharge: 2012-11-17 | Disposition: A | Discharge status: Home / Self Care | Payer: Medicaid Other | Source: Ambulatory Visit | Attending: Obstetrics and Gynecology | Admitting: Obstetrics and Gynecology

## 2012-11-17 ENCOUNTER — Ambulatory Visit (INDEPENDENT_AMBULATORY_CARE_PROVIDER_SITE_OTHER): Payer: Medicaid Other | Admitting: Obstetrics and Gynecology

## 2012-11-17 VITALS — BP 117/75 | Temp 97.9°F | Wt 176.9 lb

## 2012-11-17 DIAGNOSIS — O2340 Unspecified infection of urinary tract in pregnancy, unspecified trimester: Secondary | ICD-10-CM

## 2012-11-17 DIAGNOSIS — O2390 Unspecified genitourinary tract infection in pregnancy, unspecified trimester: Secondary | ICD-10-CM

## 2012-11-17 DIAGNOSIS — N390 Urinary tract infection, site not specified: Secondary | ICD-10-CM | POA: Insufficient documentation

## 2012-11-17 DIAGNOSIS — Z3480 Encounter for supervision of other normal pregnancy, unspecified trimester: Secondary | ICD-10-CM

## 2012-11-17 DIAGNOSIS — O289 Unspecified abnormal findings on antenatal screening of mother: Secondary | ICD-10-CM | POA: Insufficient documentation

## 2012-11-17 DIAGNOSIS — O0930 Supervision of pregnancy with insufficient antenatal care, unspecified trimester: Secondary | ICD-10-CM

## 2012-11-17 DIAGNOSIS — O99891 Other specified diseases and conditions complicating pregnancy: Secondary | ICD-10-CM | POA: Insufficient documentation

## 2012-11-17 DIAGNOSIS — L748 Other eccrine sweat disorders: Secondary | ICD-10-CM

## 2012-11-17 DIAGNOSIS — O368190 Decreased fetal movements, unspecified trimester, not applicable or unspecified: Secondary | ICD-10-CM

## 2012-11-17 DIAGNOSIS — J069 Acute upper respiratory infection, unspecified: Secondary | ICD-10-CM

## 2012-11-17 LAB — POCT URINALYSIS DIP (DEVICE)
Bilirubin Urine: NEGATIVE
Glucose, UA: NEGATIVE mg/dL
Ketones, ur: NEGATIVE mg/dL
Nitrite: POSITIVE — AB

## 2012-11-17 LAB — AMNISURE RUPTURE OF MEMBRANE (ROM) NOT AT ARMC: Amnisure ROM: NEGATIVE

## 2012-11-17 MED ORDER — GUAIFENESIN-CODEINE 100-10 MG/5ML PO SYRP: 5.0000 mL | ORAL_SOLUTION | Freq: Three times a day (TID) | ORAL | Status: DC | PRN

## 2012-11-17 MED ORDER — GUAIFENESIN-CODEINE 100-10 MG/5ML PO SOLN
5.0000 mL | Freq: Once | ORAL | Status: AC
  Administered 2012-11-17: 5 mL via ORAL
  Filled 2012-11-17: qty 5

## 2012-11-17 MED ORDER — NITROFURANTOIN MACROCRYSTAL 100 MG PO CAPS: 100.0000 mg | ORAL_CAPSULE | Freq: Every day | ORAL | Status: DC

## 2012-11-17 NOTE — Progress Notes (Signed)
Patient still feels movement daily just seems less frequent.

## 2012-11-17 NOTE — Patient Instructions (Addendum)
Fetal Movement Counts Patient Name: __________________________________________________ Patient Due Date: ____________________ Kick counts is highly recommended in high risk pregnancies, but it is a good idea for every pregnant woman to do. Start counting fetal movements at 28 weeks of the pregnancy. Fetal movements increase after eating a full meal or eating or drinking something sweet (the blood sugar is higher). It is also important to drink plenty of fluids (well hydrated) before doing the count. Lie on your left side because it helps with the circulation or you can sit in a comfortable chair with your arms over your belly (abdomen) with no distractions around you. DOING THE COUNT  Try to do the count the same time of day each time you do it.  Mark the day and time, then see how long it takes for you to feel 10 movements (kicks, flutters, swishes, rolls). You should have at least 10 movements within 2 hours. You will most likely feel 10 movements in much less than 2 hours. If you do not, wait an hour and count again. After a couple of days you will see a pattern.  What you are looking for is a change in the pattern or not enough counts in 2 hours. Is it taking longer in time to reach 10 movements? SEEK MEDICAL CARE IF:  You feel less than 10 counts in 2 hours. Tried twice.  No movement in one hour.  The pattern is changing or taking longer each day to reach 10 counts in 2 hours.  You feel the baby is not moving as it usually does. Date: ____________ Movements: ____________ Start time: ____________ Finish time: ____________  Date: ____________ Movements: ____________ Start time: ____________ Finish time: ____________ Date: ____________ Movements: ____________ Start time: ____________ Finish time: ____________ Date: ____________ Movements: ____________ Start time: ____________ Finish time: ____________ Date: ____________ Movements: ____________ Start time: ____________ Finish time:  ____________ Date: ____________ Movements: ____________ Start time: ____________ Finish time: ____________ Date: ____________ Movements: ____________ Start time: ____________ Finish time: ____________ Date: ____________ Movements: ____________ Start time: ____________ Finish time: ____________  Date: ____________ Movements: ____________ Start time: ____________ Finish time: ____________ Date: ____________ Movements: ____________ Start time: ____________ Finish time: ____________ Date: ____________ Movements: ____________ Start time: ____________ Finish time: ____________ Date: ____________ Movements: ____________ Start time: ____________ Finish time: ____________ Date: ____________ Movements: ____________ Start time: ____________ Finish time: ____________ Date: ____________ Movements: ____________ Start time: ____________ Finish time: ____________ Date: ____________ Movements: ____________ Start time: ____________ Finish time: ____________  Date: ____________ Movements: ____________ Start time: ____________ Finish time: ____________ Date: ____________ Movements: ____________ Start time: ____________ Finish time: ____________ Date: ____________ Movements: ____________ Start time: ____________ Finish time: ____________ Date: ____________ Movements: ____________ Start time: ____________ Finish time: ____________ Date: ____________ Movements: ____________ Start time: ____________ Finish time: ____________ Date: ____________ Movements: ____________ Start time: ____________ Finish time: ____________ Date: ____________ Movements: ____________ Start time: ____________ Finish time: ____________  Date: ____________ Movements: ____________ Start time: ____________ Finish time: ____________ Date: ____________ Movements: ____________ Start time: ____________ Finish time: ____________ Date: ____________ Movements: ____________ Start time: ____________ Finish time: ____________ Date: ____________ Movements:  ____________ Start time: ____________ Finish time: ____________ Date: ____________ Movements: ____________ Start time: ____________ Finish time: ____________ Date: ____________ Movements: ____________ Start time: ____________ Finish time: ____________ Date: ____________ Movements: ____________ Start time: ____________ Finish time: ____________  Date: ____________ Movements: ____________ Start time: ____________ Finish time: ____________ Date: ____________ Movements: ____________ Start time: ____________ Finish time: ____________ Date: ____________ Movements: ____________ Start time: ____________ Finish time: ____________ Date: ____________ Movements:   ____________ Start time: ____________ Finish time: ____________ Date: ____________ Movements: ____________ Start time: ____________ Finish time: ____________ Date: ____________ Movements: ____________ Start time: ____________ Finish time: ____________ Date: ____________ Movements: ____________ Start time: ____________ Finish time: ____________  Date: ____________ Movements: ____________ Start time: ____________ Finish time: ____________ Date: ____________ Movements: ____________ Start time: ____________ Finish time: ____________ Date: ____________ Movements: ____________ Start time: ____________ Finish time: ____________ Date: ____________ Movements: ____________ Start time: ____________ Finish time: ____________ Date: ____________ Movements: ____________ Start time: ____________ Finish time: ____________ Date: ____________ Movements: ____________ Start time: ____________ Finish time: ____________ Date: ____________ Movements: ____________ Start time: ____________ Finish time: ____________  Date: ____________ Movements: ____________ Start time: ____________ Finish time: ____________ Date: ____________ Movements: ____________ Start time: ____________ Finish time: ____________ Date: ____________ Movements: ____________ Start time: ____________ Finish  time: ____________ Date: ____________ Movements: ____________ Start time: ____________ Finish time: ____________ Date: ____________ Movements: ____________ Start time: ____________ Finish time: ____________ Date: ____________ Movements: ____________ Start time: ____________ Finish time: ____________ Date: ____________ Movements: ____________ Start time: ____________ Finish time: ____________  Date: ____________ Movements: ____________ Start time: ____________ Finish time: ____________ Date: ____________ Movements: ____________ Start time: ____________ Finish time: ____________ Date: ____________ Movements: ____________ Start time: ____________ Finish time: ____________ Date: ____________ Movements: ____________ Start time: ____________ Finish time: ____________ Date: ____________ Movements: ____________ Start time: ____________ Finish time: ____________ Date: ____________ Movements: ____________ Start time: ____________ Finish time: ____________ Document Released: 12/24/2006 Document Revised: 02/16/2012 Document Reviewed: 06/26/2009 [employer] Patient Information 2013 [employer].  

## 2012-11-17 NOTE — Progress Notes (Signed)
Still with occ. UCs and "back labor" relieved by prn Flexeril. FM 6-15 times/hr., at most 2-3 hr w/o FM. Cx same. FM x 8 this am but believes decreased FM over last few days> EFM. Denies UTI sx. States her MCN. rx was not in pharmacy> rx MCN suppression sent to [facility]. No sx.  Lg LE and pos nitrates (off abx x 3 wks)> C&S sent.

## 2012-11-17 NOTE — Progress Notes (Signed)
Pt seen in clinic today by Deirdre, rx was sent.

## 2012-11-17 NOTE — Addendum Note (Signed)
Addended by: Jill Side on: 11/17/2012 11:05 AM   Modules accepted: Orders

## 2012-11-17 NOTE — Addendum Note (Signed)
Addended by: Jill Side on: 11/17/2012 10:40 AM   Modules accepted: Orders

## 2012-11-17 NOTE — MAU Note (Signed)
C/o cough since Sunday; Dry non-productive cough with soreness in her chest; started new rx for cough this afternoon-cough medicine relieved "cough fits" for only 1 hour; ?SROM this afternoon; Amnisure collected and sent to lab;

## 2012-11-17 NOTE — MAU Provider Note (Signed)
History     CSN: [id number]  Arrival date and time: 11/17/12 7829   First Provider Initiated Contact with Patient 11/17/12 2113      Chief Complaint  Patient presents with  . Rupture of Membranes   HPI  Carrie Rogers is a 21 y.o. [redacted]w[redacted]d at who has had a cough since Sunday with a low grade temp of 99.0. Today she started to notice leaking of clear fluid that she feels is not urine.    Past Medical History  Diagnosis Date  . No pertinent past medical history   . Depression     Past Surgical History  Procedure Date  . No past surgeries     Family History  Problem Relation Age of Onset  . Other Neg Hx   . Diabetes Father   . Heart disease Father     History  Substance Use Topics  . Smoking status: Former Smoker -- 0.2 packs/day  . Smokeless tobacco: Never Used  . Alcohol Use: 0.6 oz/week    1 Glasses of wine per week    Allergies: No Known Allergies  Prescriptions prior to admission  Medication Sig Dispense Refill  . acetaminophen (TYLENOL) 325 MG tablet Take 650 mg by mouth every 6 (six) hours as needed. For headaches.      . cyclobenzaprine (FLEXERIL) 10 MG tablet Take 0.5 tablets (5 mg total) by mouth 3 (three) times daily as needed for muscle spasms.  30 tablet  1  . guaiFENesin-codeine (ROBITUSSIN AC) 100-10 MG/5ML syrup Take 5 mLs by mouth 3 (three) times daily as needed for cough.  120 mL  0  . Prenatal Vit-Fe Fumarate-FA (MULTIVITAMIN-PRENATAL) 27-0.8 MG TABS Take 1 tablet by mouth daily.      . sertraline (ZOLOFT) 50 MG tablet Take 50 mg by mouth daily.      . Throat Lozenges (HALLS COUGH DROPS) LOZG Use as directed 1 lozenge in the mouth or throat daily as needed. For cough.      . nitrofurantoin (MACRODANTIN) 100 MG capsule Take 1 capsule (100 mg total) by mouth at bedtime.  30 capsule  2    Review of Systems  Constitutional: Negative for fever and chills.  Eyes: Negative for blurred vision.  Respiratory: Positive for cough and sputum production.  Negative for shortness of breath and wheezing.   Cardiovascular: Negative for chest pain.  Gastrointestinal: Negative for nausea, vomiting, abdominal pain, diarrhea and constipation.  Genitourinary: Negative for dysuria, urgency and frequency.       Has a UTI, but has not started her RX.   Neurological: Negative for dizziness and headaches.   Physical Exam   Blood pressure 109/67, pulse 119, temperature 98.4 F (36.9 C), temperature source Oral, resp. rate 20, height 5\' 4"  (1.626 m), weight 80.831 kg (178 lb 3.2 oz), last menstrual period 04/03/2012.  Physical Exam  Nursing note and vitals reviewed. Constitutional: She is oriented to person, place, and time. She appears well-developed and well-nourished. No distress.  Cardiovascular: Normal rate.   Respiratory: Effort normal. No respiratory distress. She has no wheezes. She has no rales. She exhibits no tenderness.        dry/unproductive cough  GI: Soft.  Neurological: She is alert and oriented to person, place, and time.  Skin: Skin is warm and dry.  Psychiatric: She has a normal mood and affect.    MAU Course  Procedures  Results for orders placed during the hospital encounter of 11/17/12 (from the past 24 hour(s))  AMNISURE RUPTURE OF MEMBRANE (ROM)     Status: Normal   Collection Time   11/17/12  8:58 PM      Component Value Range   Amnisure ROM NEGATIVE      Assessment and Plan   1. Recurrent UTI (urinary tract infection) complicating pregnancy   2. Supervision of normal subsequent pregnancy   3. Insufficient prenatal care   4. URI (upper respiratory infection)    Take meds as prescribed in clinic today Start taking Dextromethrophan  Third trimester danger signs reviewed FU with [facility] as scheduled.   Tawnya Crook 11/17/2012, 9:15 PM

## 2012-11-17 NOTE — Addendum Note (Signed)
Addended by: Caren Griffins C on: 11/17/2012 10:43 AM   Modules accepted: Orders

## 2012-11-17 NOTE — Progress Notes (Signed)
Afebrile.Rx Robiussin DM for cough.  NST NR (10x10) and baseline 160-165> c/w Dr. Erin Fulling re BPP and if less than 8/8 will fo to MAU for NST.

## 2012-11-17 NOTE — MAU Note (Signed)
Pt G3 P2 at 33.3wks, leaking clear fluid since 1200, irreg contractions.

## 2012-11-17 NOTE — Progress Notes (Signed)
BPP 8/8

## 2012-11-17 NOTE — Addendum Note (Signed)
Addended by: Franchot Mimes on: 11/17/2012 09:31 AM   Modules accepted: Orders

## 2012-11-18 NOTE — MAU Provider Note (Signed)
Attestation of Attending Supervision of Advanced Practitioner: Evaluation and management procedures were performed by the PA/NP/CNM/OB Fellow under my supervision/collaboration. Chart reviewed and agree with management and plan.  Kamariah Fruchter V 11/18/2012 5:27 AM    

## 2012-11-19 ENCOUNTER — Telehealth: Payer: Self-pay | Admitting: General Practice

## 2012-11-19 LAB — CULTURE, OB URINE: Colony Count: >=100000

## 2012-11-19 MED ORDER — CEPHALEXIN 500 MG PO CAPS: 500.0000 mg | ORAL_CAPSULE | Freq: Four times a day (QID) | ORAL | Status: AC

## 2012-11-19 NOTE — Telephone Encounter (Signed)
Message copied by Kathee Delton on Fri Nov 19, 2012  8:31 AM ------      Message from: Danae Orleans      Created: Fri Nov 19, 2012  5:03 AM       Please tx ASB      Keflex 500 qid x 10 days

## 2012-11-19 NOTE — Telephone Encounter (Signed)
Called pt and informed her of need for antibiotic- has been sent to her pharmacy.  Pt expressed concern for the cost because she just got a Rx that cost $55.  I explained that this medicine (Keflex)  is on the $4 list.  Pt was instructed to stop the Macrodantin until the Keflex is finished, then resume the Macrodantin.  Pt voiced understanding.

## 2012-11-19 NOTE — Telephone Encounter (Signed)
Med ordered. Attempted to contact patient but there was no answer and the voicemail box was full, unable to leave message

## 2012-11-24 ENCOUNTER — Ambulatory Visit (INDEPENDENT_AMBULATORY_CARE_PROVIDER_SITE_OTHER): Payer: Medicaid Other | Admitting: Obstetrics & Gynecology

## 2012-11-24 VITALS — BP 110/78 | Temp 97.0°F | Wt 177.4 lb

## 2012-11-24 DIAGNOSIS — O2390 Unspecified genitourinary tract infection in pregnancy, unspecified trimester: Secondary | ICD-10-CM

## 2012-11-24 DIAGNOSIS — O0930 Supervision of pregnancy with insufficient antenatal care, unspecified trimester: Secondary | ICD-10-CM

## 2012-11-24 DIAGNOSIS — Z3480 Encounter for supervision of other normal pregnancy, unspecified trimester: Secondary | ICD-10-CM

## 2012-11-24 LAB — POCT URINALYSIS DIP (DEVICE)
Protein, ur: NEGATIVE mg/dL
Specific Gravity, Urine: <=1.005 (ref 1.005–1.030)
Urobilinogen, UA: 1 mg/dL (ref 0.0–1.0)

## 2012-11-24 NOTE — Progress Notes (Signed)
Pt reports B-H ctx at night.  Denies LOF or VB. +FM.  Reviewed with pt increased risk of DM later in life due to abnormal 1 hour and FH.  Reviewed diet and exercise.

## 2012-11-24 NOTE — Patient Instructions (Addendum)
Pregnancy - Third Trimester  The third trimester of pregnancy (the last 3 months) is a period of the most rapid growth for you and your baby. The baby approaches a length of 20 inches and a weight of 6 to 10 pounds. The baby is adding on fat and getting ready for life outside your body. While inside, babies have periods of sleeping and waking, suck their thumbs, and hiccups. You can often feel small contractions of the uterus. This is false labor. It is also called Braxton-Hicks contractions. This is like a practice for labor. The usual problems in this stage of pregnancy include more difficulty breathing, swelling of the hands and feet from water retention, and having to urinate more often because of the uterus and baby pressing on your bladder.   PRENATAL EXAMS  · Blood work may continue to be done during prenatal exams. These tests are done to check on your health and the probable health of your baby. Blood work is used to follow your blood levels (hemoglobin). Anemia (low hemoglobin) is common during pregnancy. Iron and vitamins are given to help prevent this. You may also continue to be checked for diabetes. Some of the past blood tests may be done again.  · The size of the uterus is measured during each visit. This makes sure your baby is growing properly according to your pregnancy dates.  · Your blood pressure is checked every prenatal visit. This is to make sure you are not getting toxemia.  · Your urine is checked every prenatal visit for infection, diabetes and protein.  · Your weight is checked at each visit. This is done to make sure gains are happening at the suggested rate and that you and your baby are growing normally.  · Sometimes, an ultrasound is performed to confirm the position and the proper growth and development of the baby. This is a test done that bounces harmless sound waves off the baby so your caregiver can more accurately determine due dates.  · Discuss the type of pain medication and  anesthesia you will have during your labor and delivery.  · Discuss the possibility and anesthesia if a Cesarean Section might be necessary.  · Inform your caregiver if there is any mental or physical violence at home.  Sometimes, a specialized non-stress test, contraction stress test and biophysical profile are done to make sure the baby is not having a problem. Checking the amniotic fluid surrounding the baby is called an amniocentesis. The amniotic fluid is removed by sticking a needle into the belly (abdomen). This is sometimes done near the end of pregnancy if an early delivery is required. In this case, it is done to help make sure the baby's lungs are mature enough for the baby to live outside of the womb. If the lungs are not mature and it is unsafe to deliver the baby, an injection of cortisone medication is given to the mother 1 to 2 days before the delivery. This helps the baby's lungs mature and makes it safer to deliver the baby.  CHANGES OCCURING IN THE THIRD TRIMESTER OF PREGNANCY  Your body goes through many changes during pregnancy. They vary from person to person. Talk to your caregiver about changes you notice and are concerned about.  · During the last trimester, you have probably had an increase in your appetite. It is normal to have cravings for certain foods. This varies from person to person and pregnancy to pregnancy.  · You may begin to   get stretch marks on your hips, abdomen, and breasts. These are normal changes in the body during pregnancy. There are no exercises or medications to take which prevent this change.  · Constipation may be treated with a stool softener or adding bulk to your diet. Drinking lots of fluids, fiber in vegetables, fruits, and whole grains are helpful.  · Exercising is also helpful. If you have been very active up until your pregnancy, most of these activities can be continued during your pregnancy. If you have been less active, it is helpful to start an exercise  program such as walking. Consult your caregiver before starting exercise programs.  · Avoid all smoking, alcohol, un-prescribed drugs, herbs and "street drugs" during your pregnancy. These chemicals affect the formation and growth of the baby. Avoid chemicals throughout the pregnancy to ensure the delivery of a healthy infant.  · Backache, varicose veins and hemorrhoids may develop or get worse.  · You will tire more easily in the third trimester, which is normal.  · The baby's movements may be stronger and more often.  · You may become short of breath easily.  · Your belly button may stick out.  · A yellow discharge may leak from your breasts called colostrum.  · You may have a bloody mucus discharge. This usually occurs a few days to a week before labor begins.  HOME CARE INSTRUCTIONS   · Keep your caregiver's appointments. Follow your caregiver's instructions regarding medication use, exercise, and diet.  · During pregnancy, you are providing food for you and your baby. Continue to eat regular, well-balanced meals. Choose foods such as meat, fish, milk and other low fat dairy products, vegetables, fruits, and whole-grain breads and cereals. Your caregiver will tell you of the ideal weight gain.  · A physical sexual relationship may be continued throughout pregnancy if there are no other problems such as early (premature) leaking of amniotic fluid from the membranes, vaginal bleeding, or belly (abdominal) pain.  · Exercise regularly if there are no restrictions. Check with your caregiver if you are unsure of the safety of your exercises. Greater weight gain will occur in the last 2 trimesters of pregnancy. Exercising helps:  · Control your weight.  · Get you in shape for labor and delivery.  · You lose weight after you deliver.  · Rest a lot with legs elevated, or as needed for leg cramps or low back pain.  · Wear a good support or jogging bra for breast tenderness during pregnancy. This may help if worn during  sleep. Pads or tissues may be used in the bra if you are leaking colostrum.  · Do not use hot tubs, steam rooms, or saunas.  · Wear your seat belt when driving. This protects you and your baby if you are in an accident.  · Avoid raw meat, cat litter boxes and soil used by cats. These carry germs that can cause birth defects in the baby.  · It is easier to loose urine during pregnancy. Tightening up and strengthening the pelvic muscles will help with this problem. You can practice stopping your urination while you are going to the bathroom. These are the same muscles you need to strengthen. It is also the muscles you would use if you were trying to stop from passing gas. You can practice tightening these muscles up 10 times a set and repeating this about 3 times per day. Once you know what muscles to tighten up, do not perform these   exercises during urination. It is more likely to cause an infection by backing up the urine.  · Ask for help if you have financial, counseling or nutritional needs during pregnancy. Your caregiver will be able to offer counseling for these needs as well as refer you for other special needs.  · Make a list of emergency phone numbers and have them available.  · Plan on getting help from family or friends when you go home from the hospital.  · Make a trial run to the hospital.  · Take prenatal classes with the father to understand, practice and ask questions about the labor and delivery.  · Prepare the baby's room/nursery.  · Do not travel out of the city unless it is absolutely necessary and with the advice of your caregiver.  · Wear only low or no heal shoes to have better balance and prevent falling.  MEDICATIONS AND DRUG USE IN PREGNANCY  · Take prenatal vitamins as directed. The vitamin should contain 1 milligram of folic acid. Keep all vitamins out of reach of children. Only a couple vitamins or tablets containing iron may be fatal to a baby or young child when ingested.  · Avoid use  of all medications, including herbs, over-the-counter medications, not prescribed or suggested by your caregiver. Only take over-the-counter or prescription medicines for pain, discomfort, or fever as directed by your caregiver. Do not use aspirin, ibuprofen (Motrin®, Advil®, Nuprin®) or naproxen (Aleve®) unless OK'd by your caregiver.  · Let your caregiver also know about herbs you may be using.  · Alcohol is related to a number of birth defects. This includes fetal alcohol syndrome. All alcohol, in any form, should be avoided completely. Smoking will cause low birth rate and premature babies.  · Street/illegal drugs are very harmful to the baby. They are absolutely forbidden. A baby born to an addicted mother will be addicted at birth. The baby will go through the same withdrawal an adult does.  SEEK MEDICAL CARE IF:  You have any concerns or worries during your pregnancy. It is better to call with your questions if you feel they cannot wait, rather than worry about them.  DECISIONS ABOUT CIRCUMCISION  You may or may not know the sex of your baby. If you know your baby is a boy, it may be time to think about circumcision. Circumcision is the removal of the foreskin of the penis. This is the skin that covers the sensitive end of the penis. There is no proven medical need for this. Often this decision is made on what is popular at the time or based upon religious beliefs and social issues. You can discuss these issues with your caregiver or pediatrician.  SEEK IMMEDIATE MEDICAL CARE IF:   · An unexplained oral temperature above 102° F (38.9° C) develops, or as your caregiver suggests.  · You have leaking of fluid from the vagina (birth canal). If leaking membranes are suspected, take your temperature and tell your caregiver of this when you call.  · There is vaginal spotting, bleeding or passing clots. Tell your caregiver of the amount and how many pads are used.  · You develop a bad smelling vaginal discharge with  a change in the color from clear to white.  · You develop vomiting that lasts more than 24 hours.  · You develop chills or fever.  · You develop shortness of breath.  · You develop burning on urination.  · You loose more than 2 pounds of weight   or gain more than 2 pounds of weight or as suggested by your caregiver.  · You notice sudden swelling of your face, hands, and feet or legs.  · You develop belly (abdominal) pain. Round ligament discomfort is a common non-cancerous (benign) cause of abdominal pain in pregnancy. Your caregiver still must evaluate you.  · You develop a severe headache that does not go away.  · You develop visual problems, blurred or double vision.  · If you have not felt your baby move for more than 1 hour. If you think the baby is not moving as much as usual, eat something with sugar in it and lie down on your left side for an hour. The baby should move at least 4 to 5 times per hour. Call right away if your baby moves less than that.  · You fall, are in a car accident or any kind of trauma.  · There is mental or physical violence at home.  Document Released: 11/18/2001 Document Revised: 02/16/2012 Document Reviewed: 05/23/2009  Exit[employer]® Patient Information ©2013 [employer].

## 2012-11-24 NOTE — Progress Notes (Signed)
p-92 

## 2012-12-08 NOTE — L&D Delivery Note (Signed)
Attestation of Attending Supervision of Advanced Practitioner (CNM/NP): Evaluation and management procedures were performed by the Advanced Practitioner under my supervision and collaboration.  I have reviewed the Advanced Practitioner's note and chart, and I agree with the management and plan.  Aleicia Kenagy 01/10/2013 8:54 AM

## 2012-12-08 NOTE — L&D Delivery Note (Signed)
Delivery Note At 7:59 PM a viable female was delivered via Vaginal, Spontaneous Delivery (Presentation: ; Occiput Anterior).  APGAR: 8, 9; weight .   Placenta status: Intact, Spontaneous.  Cord: 3 vessels with the following complications: nuchal cord x 2, somersaulted through. None.    Anesthesia: Epidural  Episiotomy: None Lacerations: 1st degree;Perineal Suture Repair: none required Est. Blood Loss (mL): 200  Mom to postpartum.  Baby to nursery-stable.  Bonnita Nasuti 01/07/2013, 8:25 PM  Attended delivery Agree with note Roetta Sessions CNM

## 2012-12-09 ENCOUNTER — Ambulatory Visit (INDEPENDENT_AMBULATORY_CARE_PROVIDER_SITE_OTHER): Payer: Medicaid Other | Admitting: Obstetrics and Gynecology

## 2012-12-09 ENCOUNTER — Encounter: Payer: Self-pay | Admitting: Obstetrics and Gynecology

## 2012-12-09 ENCOUNTER — Other Ambulatory Visit (HOSPITAL_COMMUNITY)
Admission: RE | Admit: 2012-12-09 | Discharge: 2012-12-09 | Disposition: A | Discharge status: Home / Self Care | Payer: Medicaid Other | Source: Ambulatory Visit | Attending: Obstetrics and Gynecology | Admitting: Obstetrics and Gynecology

## 2012-12-09 VITALS — BP 123/81 | Temp 99.0°F | Wt 177.5 lb

## 2012-12-09 DIAGNOSIS — O2340 Unspecified infection of urinary tract in pregnancy, unspecified trimester: Secondary | ICD-10-CM

## 2012-12-09 DIAGNOSIS — O2390 Unspecified genitourinary tract infection in pregnancy, unspecified trimester: Secondary | ICD-10-CM

## 2012-12-09 DIAGNOSIS — N390 Urinary tract infection, site not specified: Secondary | ICD-10-CM

## 2012-12-09 DIAGNOSIS — O0930 Supervision of pregnancy with insufficient antenatal care, unspecified trimester: Secondary | ICD-10-CM

## 2012-12-09 DIAGNOSIS — Z3480 Encounter for supervision of other normal pregnancy, unspecified trimester: Secondary | ICD-10-CM

## 2012-12-09 DIAGNOSIS — Z113 Encounter for screening for infections with a predominantly sexual mode of transmission: Secondary | ICD-10-CM | POA: Insufficient documentation

## 2012-12-09 LAB — POCT URINALYSIS DIP (DEVICE)
Glucose, UA: NEGATIVE mg/dL
Nitrite: NEGATIVE
Urobilinogen, UA: 0.2 mg/dL (ref 0.0–1.0)
pH: 7 (ref 5.0–8.0)

## 2012-12-09 NOTE — Progress Notes (Signed)
P=89, c/o edema in feet only, States she thinks she has lost her mucous plug because has had a lot of mucousy discharge last 2-3 days.

## 2012-12-09 NOTE — Progress Notes (Signed)
Patient doing well reporting irregular contractions and good fetal movement. FM/labor precautions reviewed. GBS and cultures collected

## 2012-12-09 NOTE — Addendum Note (Signed)
Addended by: Gerome Apley on: 12/09/2012 11:58 AM   Modules accepted: Orders

## 2012-12-13 ENCOUNTER — Encounter: Payer: Self-pay | Admitting: Obstetrics and Gynecology

## 2012-12-15 ENCOUNTER — Encounter (HOSPITAL_COMMUNITY): Payer: Self-pay | Admitting: *Deleted

## 2012-12-15 ENCOUNTER — Inpatient Hospital Stay (HOSPITAL_COMMUNITY)
Admission: AD | Admit: 2012-12-15 | Discharge: 2012-12-15 | Disposition: A | Discharge status: Home / Self Care | Payer: Medicaid Other | Source: Ambulatory Visit | Attending: Obstetrics and Gynecology | Admitting: Obstetrics and Gynecology

## 2012-12-15 ENCOUNTER — Other Ambulatory Visit (HOSPITAL_COMMUNITY)
Admission: RE | Admit: 2012-12-15 | Discharge: 2012-12-15 | Disposition: A | Discharge status: Home / Self Care | Payer: Medicaid Other | Source: Ambulatory Visit | Attending: Family Medicine | Admitting: Family Medicine

## 2012-12-15 ENCOUNTER — Ambulatory Visit (INDEPENDENT_AMBULATORY_CARE_PROVIDER_SITE_OTHER): Payer: Medicaid Other | Admitting: Family Medicine

## 2012-12-15 VITALS — BP 111/75 | Temp 99.5°F | Wt 176.8 lb

## 2012-12-15 DIAGNOSIS — R1030 Lower abdominal pain, unspecified: Secondary | ICD-10-CM

## 2012-12-15 DIAGNOSIS — N760 Acute vaginitis: Secondary | ICD-10-CM | POA: Insufficient documentation

## 2012-12-15 DIAGNOSIS — IMO0002 Reserved for concepts with insufficient information to code with codable children: Secondary | ICD-10-CM

## 2012-12-15 DIAGNOSIS — O26899 Other specified pregnancy related conditions, unspecified trimester: Secondary | ICD-10-CM

## 2012-12-15 DIAGNOSIS — O9989 Other specified diseases and conditions complicating pregnancy, childbirth and the puerperium: Secondary | ICD-10-CM

## 2012-12-15 DIAGNOSIS — O368390 Maternal care for abnormalities of the fetal heart rate or rhythm, unspecified trimester, not applicable or unspecified: Secondary | ICD-10-CM | POA: Insufficient documentation

## 2012-12-15 DIAGNOSIS — N898 Other specified noninflammatory disorders of vagina: Secondary | ICD-10-CM

## 2012-12-15 DIAGNOSIS — Z3689 Encounter for other specified antenatal screening: Secondary | ICD-10-CM

## 2012-12-15 DIAGNOSIS — O479 False labor, unspecified: Secondary | ICD-10-CM | POA: Insufficient documentation

## 2012-12-15 LAB — POCT URINALYSIS DIP (DEVICE)
Glucose, UA: NEGATIVE mg/dL
Hgb urine dipstick: NEGATIVE
Nitrite: NEGATIVE
Protein, ur: NEGATIVE mg/dL
Urobilinogen, UA: 0.2 mg/dL (ref 0.0–1.0)
pH: 7 (ref 5.0–8.0)

## 2012-12-15 LAB — CBC WITH DIFFERENTIAL/PLATELET
Basophils Absolute: 0 10*3/uL (ref 0.0–0.1)
HCT: 34.1 % — ABNORMAL LOW (ref 36.0–46.0)
Hemoglobin: 11.3 g/dL — ABNORMAL LOW (ref 12.0–15.0)
Lymphocytes Relative: 22 % (ref 12–46)
Monocytes Absolute: 0.5 10*3/uL (ref 0.1–1.0)
Monocytes Relative: 5 % (ref 3–12)
Neutro Abs: 7.1 10*3/uL (ref 1.7–7.7)
Neutrophils Relative %: 71 % (ref 43–77)
RDW: 13.1 % (ref 11.5–15.5)
WBC: 9.9 10*3/uL (ref 4.0–10.5)

## 2012-12-15 LAB — COMPREHENSIVE METABOLIC PANEL
BUN: 3 mg/dL — ABNORMAL LOW (ref 6–23)
Calcium: 9.1 mg/dL (ref 8.4–10.5)
GFR calc Af Amer: >90 mL/min (ref >90)
GFR calc non Af Amer: >90 mL/min (ref >90)
Glucose, Bld: 80 mg/dL (ref 70–99)
Total Protein: 6.4 g/dL (ref 6.0–8.3)

## 2012-12-15 MED ORDER — LACTATED RINGERS IV BOLUS (SEPSIS)
1000.0000 mL | Freq: Once | INTRAVENOUS | Status: AC
  Administered 2012-12-15: 1000 mL via INTRAVENOUS

## 2012-12-15 NOTE — Progress Notes (Signed)
Back pain. Ctx regular, last 30 min, some just tightening, at end can't sit still, very uncomfortable. Has had some crampiness like period. Copious mucous discharge. Spec exam shows thick white discharge. Wet prep and urine culture sent. Fetal tachycardia (listened for 1 minute, lowest 177). Sent for NST in office, persistent fetal tachycardia. Patient sent to MAU for prolonged monitoring.

## 2012-12-15 NOTE — MAU Note (Signed)
Pt was seen by Dr. Thad Ranger today in low risk clinic and sent to MAU for further evaluation due to fetal tachycardia.

## 2012-12-15 NOTE — Progress Notes (Signed)
Pt sent to MAU for prolonged EFM due to persistent fetal tachycardia during NST.

## 2012-12-15 NOTE — MAU Provider Note (Signed)
Chief Complaint:  Fetal Tachycardia on FHT   HPI: Carrie Rogers is a 22 y.o. G3P2002 at [redacted]w[redacted]d who presents to maternity admissions after being seen in [facility] clinic earlier and found to have fetal tachycardia.  Pt states she has been in her NSOH and denies fevers, chills, vaginal spotting/discharge, abdominal pain, nausea, vomiting, diarrhea, sick contacts, recent travel.  Does state she has been tolerating fluids and orals well without decrease intake and has had good UOP.  Continues to have B-Hx contractions occasionally but no sustained contractions.  Is having good FM.  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 GBS negative    Pregnancy Course: Has had recurrent UTI, requiring Macrobid PPx  Past Medical History: Past Medical History  Diagnosis Date  . No pertinent past medical history   . Depression     Past obstetric history: OB History    Grav Para Term Preterm Abortions TAB SAB Ect Mult Living   3 2 2       2      # Outc Date GA Lbr Len/2nd Wgt Sex Del Anes PTL Lv   1 TRM 2009 [redacted]w[redacted]d   F SVD  No    Comments: PPH, Postpartum Endometritis   2 TRM 2011 [redacted]w[redacted]d   M SVD  No    3 CUR               Past Surgical History: Past Surgical History  Procedure Date  . No past surgeries     Family History: Family History  Problem Relation Age of Onset  . Other Neg Hx   . Diabetes Father   . Heart disease Father     Social History: History  Substance Use Topics  . Smoking status: Former Smoker -- 0.2 packs/day  . Smokeless tobacco: Never Used  . Alcohol Use: 0.6 oz/week    1 Glasses of wine per week    Allergies: No Known Allergies  Meds:  Prescriptions prior to admission  Medication Sig Dispense Refill  . acetaminophen (TYLENOL) 325 MG tablet Take 650 mg by mouth every 6 (six) hours as needed. For headaches.      . cyclobenzaprine (FLEXERIL) 10 MG tablet Take 0.5 tablets (5 mg total) by mouth 3 (three) times daily as needed for muscle spasms.  30 tablet  1  .  nitrofurantoin (MACRODANTIN) 100 MG capsule Take 1 capsule (100 mg total) by mouth at bedtime.  30 capsule  2  . Prenatal Vit-Fe Fumarate-FA (MULTIVITAMIN-PRENATAL) 27-0.8 MG TABS Take 1 tablet by mouth daily.      . sertraline (ZOLOFT) 50 MG tablet Take 50 mg by mouth daily.        ROS: Pertinent findings in history of present illness.  Physical Exam  Blood pressure 105/74, pulse 98, temperature 98.2 F (36.8 C), temperature source Oral, resp. rate 18, height 5\' 4"  (1.626 m), weight 80.287 kg (177 lb), last menstrual period 04/03/2012, SpO2 100.00%, unknown if currently breastfeeding. GENERAL: Well-developed, well-nourished female in no acute distress.  HEENT: normocephalic HEART: Tachycardic, +1/6 SEM RUSB  RESP: normal effort ABDOMEN: Soft, non-tender, gravid appropriate for gestational age EXTREMITIES: Nontender, Trace edema  NEURO: alert and oriented SPECULUM EXAM: NEFG, physiologic discharge, no blood, cervix clean Dilation:  (Defered, checked in clinic prior to arrival)  FHT:  Baseline 165 , moderate variability, accelerations present, no decelerations Contractions: not prsent    Labs: Results for orders placed in visit on 12/15/12 (from the past 24 hour(s))  POCT URINALYSIS DIP (DEVICE)  Status: Abnormal   Collection Time   12/15/12 10:21 AM      Component Value Range   Glucose, UA NEGATIVE  NEGATIVE mg/dL   Bilirubin Urine NEGATIVE  NEGATIVE   Ketones, ur NEGATIVE  NEGATIVE mg/dL   Specific Gravity, Urine 1.015  1.005 - 1.030   Hgb urine dipstick NEGATIVE  NEGATIVE   pH 7.0  5.0 - 8.0   Protein, ur NEGATIVE  NEGATIVE mg/dL   Urobilinogen, UA 0.2  0.0 - 1.0 mg/dL   Nitrite NEGATIVE  NEGATIVE   Leukocytes, UA SMALL (*) NEGATIVE    Imaging:  US Fetal Bpp W/o Non Stress  11/17/2012  OBSTETRICAL ULTRASOUND: This exam was performed within a [facility] [facility]. The OB US report was generated in the [facility] system, and faxed to the ordering physician.   This  report is also available in [employer] and in the [employer]. See [facility] Obstetric US report.   MAU Course: 1) Will go ahead and give 1 L Bolus for possible dehydration.  UA not consistent with infection.  Will get CMP/CBC for further evaluation as well.  Continue to observe and if responds to bolus, can be d/c.    Fetal heart rate decreased to 150s, mod variability, + accels with IV hydration.   Assessment: 1. Fetal tachycardia before the onset of labor   2. NST (non-stress test) reactive      Plan: D/C home. F/U as scheduled or sooner PRN. Labor precautions and fetal kick counts   Briscoe Deutscher, DO 12/15/2012 1:25 PM  I saw and examined patient along with student and agree with above note.   Jashua Knaak 12/15/2012 3:17 PM

## 2012-12-15 NOTE — Progress Notes (Signed)
P=86,  c/o edema feet , ankles as usual,

## 2012-12-16 LAB — URINALYSIS, MICROSCOPIC ONLY: Casts: NONE SEEN

## 2012-12-16 NOTE — MAU Provider Note (Signed)
Attestation of Attending Supervision of Advanced Practitioner (CNM/NP): Evaluation and management procedures were performed by the Advanced Practitioner under my supervision and collaboration.  I have reviewed the Advanced Practitioner's note and chart, and I agree with the management and plan.  Ania Levay 12/16/2012 1:27 PM

## 2012-12-17 ENCOUNTER — Telehealth: Payer: Self-pay | Admitting: Family Medicine

## 2012-12-17 LAB — CULTURE, OB URINE: Colony Count: 50000

## 2012-12-17 MED ORDER — METRONIDAZOLE 500 MG PO TABS: 500.0000 mg | ORAL_TABLET | Freq: Two times a day (BID) | ORAL | Status: AC

## 2012-12-17 NOTE — Telephone Encounter (Signed)
Order entry, f/u lab. BV - treat with metronidazole.

## 2012-12-19 ENCOUNTER — Telehealth: Payer: Self-pay | Admitting: Family Medicine

## 2012-12-19 MED ORDER — FLUCONAZOLE 150 MG PO TABS: 150.0000 mg | ORAL_TABLET | Freq: Once | ORAL | Status: DC

## 2012-12-19 NOTE — Telephone Encounter (Signed)
Pt also has yeast growing from urine culture. Will treat with diflucan.

## 2012-12-22 ENCOUNTER — Ambulatory Visit (INDEPENDENT_AMBULATORY_CARE_PROVIDER_SITE_OTHER): Payer: Medicaid Other | Admitting: Advanced Practice Midwife

## 2012-12-22 VITALS — BP 124/82 | Temp 98.0°F | Wt 177.0 lb

## 2012-12-22 DIAGNOSIS — O0930 Supervision of pregnancy with insufficient antenatal care, unspecified trimester: Secondary | ICD-10-CM

## 2012-12-22 DIAGNOSIS — Z3480 Encounter for supervision of other normal pregnancy, unspecified trimester: Secondary | ICD-10-CM

## 2012-12-22 LAB — POCT URINALYSIS DIP (DEVICE)
Bilirubin Urine: NEGATIVE
Glucose, UA: NEGATIVE mg/dL
Hgb urine dipstick: NEGATIVE
Ketones, ur: NEGATIVE mg/dL
Nitrite: NEGATIVE
Protein, ur: NEGATIVE mg/dL
Specific Gravity, Urine: 1.02 (ref 1.005–1.030)
Urobilinogen, UA: 1 mg/dL (ref 0.0–1.0)
pH: 6.5 (ref 5.0–8.0)

## 2012-12-22 NOTE — Progress Notes (Signed)
Pulse 115.   Edema trace in ankles and feet.  C/o pain in pelvic.  Vaginal d/c stated as thin clear mucous; no itch, no odor.

## 2012-12-22 NOTE — Progress Notes (Signed)
Feeling well. Having some trouble sleeping d/t increased BH contractions at night. Discussed comfort measures.

## 2012-12-29 ENCOUNTER — Ambulatory Visit (INDEPENDENT_AMBULATORY_CARE_PROVIDER_SITE_OTHER): Payer: Medicaid Other | Admitting: Advanced Practice Midwife

## 2012-12-29 VITALS — BP 109/76 | Temp 98.0°F | Wt 183.2 lb

## 2012-12-29 DIAGNOSIS — N390 Urinary tract infection, site not specified: Secondary | ICD-10-CM

## 2012-12-29 DIAGNOSIS — O2340 Unspecified infection of urinary tract in pregnancy, unspecified trimester: Secondary | ICD-10-CM

## 2012-12-29 DIAGNOSIS — O2390 Unspecified genitourinary tract infection in pregnancy, unspecified trimester: Secondary | ICD-10-CM

## 2012-12-29 LAB — POCT URINALYSIS DIP (DEVICE)
Bilirubin Urine: NEGATIVE
Glucose, UA: NEGATIVE mg/dL
Ketones, ur: NEGATIVE mg/dL
Specific Gravity, Urine: 1.02 (ref 1.005–1.030)
Urobilinogen, UA: 1 mg/dL (ref 0.0–1.0)

## 2012-12-29 MED ORDER — CEPHALEXIN 500 MG PO CAPS: 500.0000 mg | ORAL_CAPSULE | Freq: Four times a day (QID) | ORAL | Status: AC

## 2012-12-29 NOTE — Progress Notes (Signed)
Pulse- 81 Patient reports pelvic pain/pressure and occasional contractions

## 2012-12-29 NOTE — Patient Instructions (Addendum)
Normal Labor and Delivery  Your caregiver must first be sure you are in labor. Signs of labor include:  · You may pass what is called "the mucus plug" before labor begins. This is a small amount of blood stained mucus.  · Regular uterine contractions.  · The time between contractions get closer together.  · The discomfort and pain gradually gets more intense.  · Pains are mostly located in the back.  · Pains get worse when walking.  · The cervix (the opening of the uterus becomes thinner (begins to efface) and opens up (dilates).  Once you are in labor and admitted into the hospital or care center, your caregiver will do the following:  · A complete physical examination.  · Check your vital signs (blood pressure, pulse, temperature and the fetal heart rate).  · Do a vaginal examination (using a sterile glove and lubricant) to determine:  · The position (presentation) of the baby (head [vertex] or buttock first).  · The level (station) of the baby's head in the birth canal.  · The effacement and dilatation of the cervix.  · You may have your pubic hair shaved and be given an enema depending on your caregiver and the circumstance.  · An electronic monitor is usually placed on your abdomen. The monitor follows the length and intensity of the contractions, as well as the baby's heart rate.  · Usually, your caregiver will insert an IV in your arm with a bottle of sugar water. This is done as a precaution so that medications can be given to you quickly during labor or delivery.  NORMAL LABOR AND DELIVERY IS DIVIDED UP INTO 3 STAGES:  First Stage  This is when regular contractions begin and the cervix begins to efface and dilate. This stage can last from 3 to 15 hours. The end of the first stage is when the cervix is 100% effaced and 10 centimeters dilated. Pain medications may be given by   · Injection (morphine, demerol, etc.)  · Regional anesthesia (spinal, caudal or epidural, anesthetics given in different locations of  the spine). Paracervical pain medication may be given, which is an injection of and anesthetic on each side of the cervix.  A pregnant woman may request to have "Natural Childbirth" which is not to have any medications or anesthesia during her labor and delivery.  Second Stage  This is when the baby comes down through the birth canal (vagina) and is born. This can take 1 to 4 hours. As the baby's head comes down through the birth canal, you may feel like you are going to have a bowel movement. You will get the urge to bear down and push until the baby is delivered. As the baby's head is being delivered, the caregiver will decide if an episiotomy (a cut in the perineum and vagina area) is needed to prevent tearing of the tissue in this area. The episiotomy is sewn up after the delivery of the baby and placenta. Sometimes a mask with nitrous oxide is given for the mother to breath during the delivery of the baby to help if there is too much pain. The end of Stage 2 is when the baby is fully delivered. Then when the umbilical cord stops pulsating it is clamped and cut.  Third Stage  The third stage begins after the baby is completely delivered and ends after the placenta (afterbirth) is delivered. This usually takes 5 to 30 minutes. After the placenta is delivered, a medication   is given either by intravenous or injection to help contract the uterus and prevent bleeding. The third stage is not painful and pain medication is usually not necessary. If an episiotomy was done, it is repaired at this time.  After the delivery, the mother is watched and monitored closely for 1 to 2 hours to make sure there is no postpartum bleeding (hemorrhage). If there is a lot of bleeding, medication is given to contract the uterus and stop the bleeding.  Document Released: 09/02/2008 Document Revised: 02/16/2012 Document Reviewed: 09/02/2008  [employer]® Patient Information ©2013 [employer].

## 2012-12-29 NOTE — Progress Notes (Signed)
Urine shows Leukocytes and Nitrites >> will culture and treat with Keflex.  Had been treated with Macrobid before and was placed on daily suppressive Macrobid, but was told to stop it.   Will be 40 wks on 26th, wants to be induced Friday due to Childcare issues (will be 40.5).  I think this is OK. Got induced withfirst two, labor went well on both. 12 hrs on second.

## 2012-12-31 LAB — CULTURE, OB URINE: Colony Count: >=100000

## 2013-01-05 ENCOUNTER — Telehealth (HOSPITAL_COMMUNITY): Payer: Self-pay | Admitting: *Deleted

## 2013-01-05 ENCOUNTER — Ambulatory Visit (INDEPENDENT_AMBULATORY_CARE_PROVIDER_SITE_OTHER): Payer: Medicaid Other | Admitting: Family Medicine

## 2013-01-05 VITALS — BP 116/76 | Temp 98.1°F | Wt 181.8 lb

## 2013-01-05 DIAGNOSIS — Z3490 Encounter for supervision of normal pregnancy, unspecified, unspecified trimester: Secondary | ICD-10-CM

## 2013-01-05 DIAGNOSIS — O0930 Supervision of pregnancy with insufficient antenatal care, unspecified trimester: Secondary | ICD-10-CM

## 2013-01-05 LAB — POCT URINALYSIS DIP (DEVICE)
Bilirubin Urine: NEGATIVE
Glucose, UA: NEGATIVE mg/dL
Ketones, ur: NEGATIVE mg/dL
Leukocytes, UA: NEGATIVE
Nitrite: NEGATIVE

## 2013-01-05 NOTE — Progress Notes (Signed)
P=76,Has stopped macrodantin while on Keflex as instructed by M. Clinton Sawyer, CNM. C/o trace edema in feet/ankles.

## 2013-01-05 NOTE — Telephone Encounter (Signed)
Preadmission screen  

## 2013-01-05 NOTE — Progress Notes (Signed)
Pt desires IOL on 1/31.

## 2013-01-05 NOTE — Patient Instructions (Signed)
Labor Induction  Most women go into labor on their own between 37 and 42 weeks of the pregnancy. When this does not happen or when there is a medical need, medicine or other methods may be used to induce labor. Labor induction causes a pregnant woman's uterus to contract. It also causes the cervix to soften (ripen), open (dilate), and thin out (efface). Usually, labor is not induced before 39 weeks of the pregnancy unless there is a problem with the baby or mother. Whether your labor will be induced depends on a number of factors, including the following:  The medical condition of you and the baby.  How many weeks along you are.  The status of baby's lung maturity.  The condition of the cervix.  The position of the baby. REASONS FOR LABOR INDUCTION  The health of the baby or mother is at risk.  The pregnancy is overdue by 1 week or more.  The water breaks but labor does not start on its own.  The mother has a health condition or serious illness such as high blood pressure, infection, placental abruption, or diabetes.  The amniotic fluid amounts are low around the baby.  The baby is distressed. REASONS TO NOT INDUCE LABOR Labor induction may not be a good idea if:  It is shown that your baby does not tolerate labor.  An induction is just more convenient.  You want the baby to be born on a certain date, like a holiday.  You have had previous surgeries on your uterus, such as a myomectomy or the removal of fibroids.  Your placenta lies very low in the uterus and blocks the opening of the cervix (placenta previa).  Your baby is not in a head down position.  The umbilical cord drops down into the birth canal in front of the baby. This could cut off the baby's blood and oxygen supply.  You have had a previous cesarean delivery.  There areunusual circumstances, such as the baby being extremely premature. RISKS AND COMPLICATIONS Problems may occur in the process of induction  and plans may need to be modified as a situation unfolds. Some of the risks of induction include:  Change in fetal heart rate, such as too high, too low, or erratic.  Risk of fetal distress.  Risk of infection to mother and baby.  Increased chance of having a cesarean delivery.  The rare, but increased chance that the placenta will separate from the uterus (abruption).  Uterine rupture (very rare). When induction is needed for medical reasons, the benefits of induction may outweigh the risks. BEFORE THE PROCEDURE Your caregiver will check your cervix and the baby's position. This will help your caregiver decide if you are far enough along for an induction to work. PROCEDURE Several methods of labor induction may be used, such as:   Taking prostaglandin medicine to dilate and ripen the cervix. The medicine will also start contractions. It can be taken by mouth or by inserting a suppository into the vagina.  A thin tube (catheter) with a balloon on the end may be inserted into your vagina to dilate the cervix. Once inserted, the balloon expands with water, which causes the cervix to open.  Striping the membranes. Your caregiver inserts a finger between the cervix and membranes, which causes the cervix to be stretched and may cause the uterus to contract. This is often done during an office visit. You will be sent home to wait for the contractions to begin. You will   then come in for an induction.  Breaking the water. Your caregiver will make a hole in the amniotic sac using a small instrument. Once the amniotic sac breaks, contractions should begin. This may still take hours to see an effect.  Taking medicine to trigger or strengthen contractions. This medicine is given intravenously through a tube in your arm. All of the methods of induction, besides stripping the membranes, will be done in the hospital. Induction is done in the hospital so that you and the baby can be carefully  monitored. AFTER THE PROCEDURE Some inductions can take up to 2 or 3 days. Depending on the cervix, it usually takes less time. It takes longer when you are induced early in the pregnancy or if this is your first pregnancy. If a mother is still pregnant and the induction has been going on for 2 to 3 days, either the mother will be sent home or a cesarean delivery will be needed. Document Released: 04/15/2007 Document Revised: 02/16/2012 Document Reviewed: 09/29/2011 Exit[employer] Patient Information 2013 [employer].  

## 2013-01-05 NOTE — Progress Notes (Signed)
No contractions, LOF or bleeding. Would like to schedule induction Friday ([redacted]w[redacted]d) due to childcare issues. Called L&D. Scheduled for 7:30 Friday AM. NST today reactive: baseline 155, accels present, no decels.

## 2013-01-07 ENCOUNTER — Encounter (HOSPITAL_COMMUNITY): Payer: Self-pay

## 2013-01-07 ENCOUNTER — Encounter (HOSPITAL_COMMUNITY): Payer: Self-pay | Admitting: Anesthesiology

## 2013-01-07 ENCOUNTER — Inpatient Hospital Stay (HOSPITAL_COMMUNITY)
Admission: RE | Admit: 2013-01-07 | Discharge: 2013-01-09 | DRG: 775 | Disposition: A | Discharge status: Home / Self Care | Payer: Medicaid Other | Source: Ambulatory Visit | Attending: Obstetrics and Gynecology | Admitting: Obstetrics and Gynecology

## 2013-01-07 ENCOUNTER — Inpatient Hospital Stay (HOSPITAL_COMMUNITY): Payer: Medicaid Other | Admitting: Anesthesiology

## 2013-01-07 VITALS — BP 102/61 | HR 73 | Temp 98.0°F | Resp 18 | Ht 64.0 in | Wt 181.0 lb

## 2013-01-07 DIAGNOSIS — O0930 Supervision of pregnancy with insufficient antenatal care, unspecified trimester: Secondary | ICD-10-CM

## 2013-01-07 DIAGNOSIS — O480 Post-term pregnancy: Secondary | ICD-10-CM

## 2013-01-07 LAB — OB RESULTS CONSOLE RUBELLA ANTIBODY, IGM: Rubella: IMMUNE

## 2013-01-07 LAB — RPR: RPR Ser Ql: NONREACTIVE

## 2013-01-07 LAB — TYPE AND SCREEN

## 2013-01-07 LAB — CBC
Hemoglobin: 11.3 g/dL — ABNORMAL LOW (ref 12.0–15.0)
MCH: 30.3 pg (ref 26.0–34.0)
MCHC: 33.4 g/dL (ref 30.0–36.0)
RDW: 12.9 % (ref 11.5–15.5)

## 2013-01-07 MED ORDER — LIDOCAINE HCL (PF) 1 % IJ SOLN
INTRAMUSCULAR | Status: DC | PRN
  Administered 2013-01-07 (×2): 9 mL

## 2013-01-07 MED ORDER — PRENATAL MULTIVITAMIN CH
1.0000 | ORAL_TABLET | Freq: Every day | ORAL | Status: DC
  Administered 2013-01-08 – 2013-01-09 (×2): 1 via ORAL
  Filled 2013-01-07 (×2): qty 1

## 2013-01-07 MED ORDER — ZOLPIDEM TARTRATE 5 MG PO TABS: 5.0000 mg | ORAL_TABLET | Freq: Every evening | ORAL | Status: DC | PRN

## 2013-01-07 MED ORDER — IBUPROFEN 600 MG PO TABS
600.0000 mg | ORAL_TABLET | Freq: Four times a day (QID) | ORAL | Status: DC
  Administered 2013-01-07 – 2013-01-09 (×6): 600 mg via ORAL
  Filled 2013-01-07 (×5): qty 1

## 2013-01-07 MED ORDER — OXYTOCIN 40 UNITS IN LACTATED RINGERS INFUSION - SIMPLE MED
1.0000 m[IU]/min | INTRAVENOUS | Status: DC
  Administered 2013-01-07: 2 m[IU]/min via INTRAVENOUS
  Filled 2013-01-07: qty 1000

## 2013-01-07 MED ORDER — DIPHENHYDRAMINE HCL 25 MG PO CAPS: 25.0000 mg | ORAL_CAPSULE | Freq: Four times a day (QID) | ORAL | Status: DC | PRN

## 2013-01-07 MED ORDER — BENZOCAINE-MENTHOL 20-0.5 % EX AERO
1.0000 "application " | INHALATION_SPRAY | CUTANEOUS | Status: DC | PRN
  Administered 2013-01-07: 1 via TOPICAL
  Filled 2013-01-07: qty 56

## 2013-01-07 MED ORDER — OXYCODONE-ACETAMINOPHEN 5-325 MG PO TABS: 1.0000 | ORAL_TABLET | ORAL | Status: DC | PRN

## 2013-01-07 MED ORDER — EPHEDRINE 5 MG/ML INJ: 10.0000 mg | INTRAVENOUS | Status: DC | PRN

## 2013-01-07 MED ORDER — CITRIC ACID-SODIUM CITRATE 334-500 MG/5ML PO SOLN: 30.0000 mL | ORAL | Status: DC | PRN

## 2013-01-07 MED ORDER — EPHEDRINE 5 MG/ML INJ
10.0000 mg | INTRAVENOUS | Status: DC | PRN
  Filled 2013-01-07: qty 4

## 2013-01-07 MED ORDER — IBUPROFEN 600 MG PO TABS
600.0000 mg | ORAL_TABLET | Freq: Four times a day (QID) | ORAL | Status: DC | PRN
  Filled 2013-01-07: qty 1

## 2013-01-07 MED ORDER — LANOLIN HYDROUS EX OINT: TOPICAL_OINTMENT | CUTANEOUS | Status: DC | PRN

## 2013-01-07 MED ORDER — TERBUTALINE SULFATE 1 MG/ML IJ SOLN: 0.2500 mg | Freq: Once | INTRAMUSCULAR | Status: DC | PRN

## 2013-01-07 MED ORDER — LACTATED RINGERS IV SOLN: 500.0000 mL | INTRAVENOUS | Status: DC | PRN

## 2013-01-07 MED ORDER — SERTRALINE HCL 50 MG PO TABS
50.0000 mg | ORAL_TABLET | Freq: Every day | ORAL | Status: DC
  Administered 2013-01-07: 50 mg via ORAL
  Filled 2013-01-07 (×4): qty 1

## 2013-01-07 MED ORDER — LACTATED RINGERS IV SOLN
INTRAVENOUS | Status: DC
  Administered 2013-01-07 (×5): via INTRAVENOUS

## 2013-01-07 MED ORDER — SIMETHICONE 80 MG PO CHEW: 80.0000 mg | CHEWABLE_TABLET | ORAL | Status: DC | PRN

## 2013-01-07 MED ORDER — ONDANSETRON HCL 4 MG/2ML IJ SOLN: 4.0000 mg | INTRAMUSCULAR | Status: DC | PRN

## 2013-01-07 MED ORDER — OXYTOCIN BOLUS FROM INFUSION
500.0000 mL | INTRAVENOUS | Status: DC
  Administered 2013-01-07: 500 mL via INTRAVENOUS

## 2013-01-07 MED ORDER — OXYTOCIN 40 UNITS IN LACTATED RINGERS INFUSION - SIMPLE MED: 62.5000 mL/h | INTRAVENOUS | Status: DC

## 2013-01-07 MED ORDER — ONDANSETRON HCL 4 MG/2ML IJ SOLN: 4.0000 mg | Freq: Four times a day (QID) | INTRAMUSCULAR | Status: DC | PRN

## 2013-01-07 MED ORDER — ONDANSETRON HCL 4 MG PO TABS: 4.0000 mg | ORAL_TABLET | ORAL | Status: DC | PRN

## 2013-01-07 MED ORDER — FENTANYL 2.5 MCG/ML BUPIVACAINE 1/10 % EPIDURAL INFUSION (WH - ANES)
14.0000 mL/h | INTRAMUSCULAR | Status: DC
  Filled 2013-01-07: qty 125

## 2013-01-07 MED ORDER — FLEET ENEMA 7-19 GM/118ML RE ENEM: 1.0000 | ENEMA | RECTAL | Status: DC | PRN

## 2013-01-07 MED ORDER — DIPHENHYDRAMINE HCL 50 MG/ML IJ SOLN: 12.5000 mg | INTRAMUSCULAR | Status: DC | PRN

## 2013-01-07 MED ORDER — TETANUS-DIPHTH-ACELL PERTUSSIS 5-2.5-18.5 LF-MCG/0.5 IM SUSP: 0.5000 mL | Freq: Once | INTRAMUSCULAR | Status: DC

## 2013-01-07 MED ORDER — PHENYLEPHRINE 40 MCG/ML (10ML) SYRINGE FOR IV PUSH (FOR BLOOD PRESSURE SUPPORT)
80.0000 ug | PREFILLED_SYRINGE | INTRAVENOUS | Status: DC | PRN
  Filled 2013-01-07: qty 5

## 2013-01-07 MED ORDER — LACTATED RINGERS IV SOLN: 500.0000 mL | Freq: Once | INTRAVENOUS | Status: DC

## 2013-01-07 MED ORDER — WITCH HAZEL-GLYCERIN EX PADS: 1.0000 "application " | MEDICATED_PAD | CUTANEOUS | Status: DC | PRN

## 2013-01-07 MED ORDER — SENNOSIDES-DOCUSATE SODIUM 8.6-50 MG PO TABS
2.0000 | ORAL_TABLET | Freq: Every day | ORAL | Status: DC
  Administered 2013-01-07 – 2013-01-08 (×2): 2 via ORAL

## 2013-01-07 MED ORDER — PHENYLEPHRINE 40 MCG/ML (10ML) SYRINGE FOR IV PUSH (FOR BLOOD PRESSURE SUPPORT): 80.0000 ug | PREFILLED_SYRINGE | INTRAVENOUS | Status: DC | PRN

## 2013-01-07 MED ORDER — OXYCODONE-ACETAMINOPHEN 5-325 MG PO TABS
1.0000 | ORAL_TABLET | ORAL | Status: DC | PRN
  Administered 2013-01-08: 1 via ORAL
  Filled 2013-01-07: qty 1

## 2013-01-07 MED ORDER — LIDOCAINE HCL (PF) 1 % IJ SOLN
30.0000 mL | INTRAMUSCULAR | Status: DC | PRN
  Filled 2013-01-07 (×2): qty 30

## 2013-01-07 MED ORDER — DIBUCAINE 1 % RE OINT: 1.0000 "application " | TOPICAL_OINTMENT | RECTAL | Status: DC | PRN

## 2013-01-07 MED ORDER — ACETAMINOPHEN 325 MG PO TABS: 650.0000 mg | ORAL_TABLET | ORAL | Status: DC | PRN

## 2013-01-07 MED ORDER — FENTANYL 2.5 MCG/ML BUPIVACAINE 1/10 % EPIDURAL INFUSION (WH - ANES)
INTRAMUSCULAR | Status: DC | PRN
  Administered 2013-01-07: 14 mL/h via EPIDURAL

## 2013-01-07 NOTE — Progress Notes (Signed)
Patient ID: Carrie Rogers, female   DOB: 1991/11/05, 22 y.o.   MRN: [id number]  Doing well but ready for epidural.   FHR 145-150s Average variability  UCs every 1-2 minutes  Cervix deferred until after Epidural  Anticipate SVD

## 2013-01-07 NOTE — Progress Notes (Signed)
Seen and agree with note Wynelle Bourgeois CNM

## 2013-01-07 NOTE — H&P (Signed)
Carrie Rogers is a 22 y.o. G3P2002 at [redacted]w[redacted]d who presents for induction of labor due to post dates. Her pregnancy has been uncomplicated, she has had prenatal care at [facility]. She did recently have a UTI, and completed her antibiotic therapy yesterday.   She has not noted any bleeding, loss of fluid, and continues to feel good fetal movement.       Maternal Medical History:  Reason for admission: Reason for Admission:   nauseaContractions: Frequency: rare.   Perceived severity is mild.    Fetal activity: Perceived fetal activity is normal.      OB History    Grav Para Term Preterm Abortions TAB SAB Ect Mult Living   3 2 2       2      Past Medical History  Diagnosis Date  . No pertinent past medical history   . Depression    Past Surgical History  Procedure Date  . No past surgeries    Family History: family history includes Diabetes in her father and Heart disease in her father.  There is no history of Other. Social History:  reports that she has quit smoking. She has never used smokeless tobacco. She reports that she drinks about .6 ounces of alcohol per week. She reports that she uses illicit drugs.   Prenatal Transfer Tool  Maternal Diabetes: No Genetic Screening: Declined Maternal Ultrasounds/Referrals: Normal Fetal Ultrasounds or other Referrals:  None Maternal Substance Abuse:  No Significant Maternal Medications:  Meds include: Zoloft Significant Maternal Lab Results:  Lab values include: Group B Strep negative Other Comments:  None  Review of Systems  Constitutional: Negative for fever.  Eyes: Negative for blurred vision.  Respiratory: Negative for cough.   Cardiovascular: Negative for chest pain.  Gastrointestinal: Negative for nausea, vomiting, diarrhea and constipation.  Genitourinary: Negative for dysuria.  Musculoskeletal: Negative for myalgias.  Neurological: Negative for dizziness, tingling and headaches.      Blood pressure 117/64, pulse 97,  temperature 98.5 F (36.9 C), temperature source Oral, resp. rate 20, height 5\' 4"  (1.626 m), weight 82.101 kg (181 lb), last menstrual period 04/03/2012, unknown if currently breastfeeding. Maternal Exam:  Uterine Assessment: No contractions noted on toco  Pelvis: adequate for delivery.      Fetal Exam Fetal Monitor Review: Baseline rate: 165.  Variability: moderate (6-25 bpm).   Pattern: accelerations present and no decelerations.    Fetal State Assessment: Category I - tracings are normal.     Physical Exam  Constitutional: She is oriented to person, place, and time. She appears well-developed and well-nourished. No distress.  HENT:  Head: Normocephalic and atraumatic.  Eyes: Conjunctivae normal are normal. Pupils are equal, round, and reactive to light.  Neck: Normal range of motion. Neck supple.  Cardiovascular: Normal rate, regular rhythm, normal heart sounds and intact distal pulses.   No murmur heard. Respiratory: Effort normal and breath sounds normal. No respiratory distress. She has no wheezes.  GI: Bowel sounds are normal. There is no tenderness.       gravid  Musculoskeletal: Normal range of motion. She exhibits no edema.  Neurological: She is alert and oriented to person, place, and time.  Skin: Skin is warm and dry.  Psychiatric: She has a normal mood and affect. Her behavior is normal. Judgment and thought content normal.    Dilation: 3 Effacement (%): 60 Station: -2 Presentation: Vertex Exam by:: Raliegh Ip RN   Prenatal labs: ABO, Rh: O/POS/-- (10/09 1103) Antibody: NEG (10/09 1103)  Rubella: Immune (01/31 0000) RPR: NON REAC (10/09 1103)  HBsAg: NEGATIVE (10/09 1103)  HIV: NON REACTIVE (11/13 1034)  GBS: Negative (01/02 0000)  1 hr GTT 146, 3 hr 104   Assessment/Plan: 22 y.o. G3P2002 at [redacted]w[redacted]d presenting for induction of labor due to post dates Admit to labor and delivery GBS negative Bishop score indicates favorable cervix: induce with  pitocin Desires epidural on request Anticipate SVD  CONROY, LOUISA 01/07/2013, 9:02 AM  Seen and examined Agree with note Will proceed with Pitocin induction.

## 2013-01-07 NOTE — Progress Notes (Signed)
Carrie Rogers is a 22 y.o. G3P2002 at [redacted]w[redacted]d by ultrasound admitted for induction of labor due to Post dates.  Subjective: Pitocin just started at 11am. Not started earlier due to prolonged acceleration of fetal heart rate.   Objective: BP 120/61  Pulse 75  Temp 97.5 F (36.4 C) (Axillary)  Resp 20  Ht 5\' 4"  (1.626 m)  Wt 181 lb (82.101 kg)  BMI 31.07 kg/m2  LMP 04/03/2012  Breastfeeding? Unknown      FHT:  FHR: 150 bpm, variability: moderate,  accelerations:  Present,  decelerations:  Absent UC:   irregular, every 3 minutes SVE:   Dilation: 3 Effacement (%): 60 Station: -2 Exam by:: Raliegh Ip RN  Labs: Lab Results  Component Value Date   WBC 8.3 01/07/2013   HGB 11.3* 01/07/2013   HCT 33.8* 01/07/2013   MCV 90.6 01/07/2013   PLT 182 01/07/2013    Assessment / Plan: Induction of labor due to postterm,  progressing well on pitocin  Labor: Progressing normally and Progressing on Pitocin, will continue to increase then AROM Preeclampsia:   Fetal Wellbeing:  Category I Pain Control:  Epidural, wants epidural when starts hurting more I/D:  n/a Anticipated MOD:  NSVD  [facility] 01/07/2013, 11:43 AM

## 2013-01-07 NOTE — H&P (Signed)
Attestation of Attending Supervision of Advanced Practitioner (CNM/NP): Evaluation and management procedures were performed by the Advanced Practitioner under my supervision and collaboration.  I have reviewed the Advanced Practitioner's note and chart, and I agree with the management and plan.  Mayes Sangiovanni 01/07/2013 11:06 AM   

## 2013-01-07 NOTE — Progress Notes (Signed)
Carrie Rogers is a 22 y.o. G3P2002 at 110w5d by admitted for induction of labor due to Post dates.   Subjective: Resting comfortably with epidural placed.  Objective: BP 117/63  Pulse 75  Temp 98.2 F (36.8 C) (Oral)  Resp 20  Ht 5\' 4"  (1.626 m)  Wt 82.101 kg (181 lb)  BMI 31.07 kg/m2  SpO2 100%  LMP 04/03/2012  Breastfeeding? Unknown      FHT:  FHR: 150 bpm, variability: moderate,  accelerations:  Present,  decelerations:  Absent UC:   regular, every 1.5-2 minutes SVE:   Dilation: 5 Effacement (%): 70 Station: -2 Exam by:: Carrie Rogers  Labs: Lab Results  Component Value Date   WBC 8.3 01/07/2013   HGB 11.3* 01/07/2013   HCT 33.8* 01/07/2013   MCV 90.6 01/07/2013   PLT 182 01/07/2013    Assessment / Plan: Induction of labor due to postterm,  progressing well on pitocin  Labor: Progressing on Pitocin, will continue to increase then AROM Fetal Wellbeing:  Category I Pain Control:  Epidural Anticipated MOD:  NSVD  Carrie Rogers 01/07/2013, 5:24 PM

## 2013-01-07 NOTE — Anesthesia Preprocedure Evaluation (Signed)
Anesthesia Evaluation  Patient identified by MRN, date of birth, ID band Patient awake    Reviewed: Allergy & Precautions, H&P , NPO status , Patient's Chart, lab work & pertinent test results  Airway Mallampati: II TM Distance: >3 FB Neck ROM: full    Dental No notable dental hx.    Pulmonary neg pulmonary ROS,    Pulmonary exam normal       Cardiovascular negative cardio ROS      Neuro/Psych PSYCHIATRIC DISORDERS Depression negative neurological ROS     GI/Hepatic negative GI ROS, Neg liver ROS,   Endo/Other  negative endocrine ROS  Renal/GU negative Renal ROS  negative genitourinary   Musculoskeletal negative musculoskeletal ROS (+)   Abdominal Normal abdominal exam  (+)   Peds negative pediatric ROS (+)  Hematology negative hematology ROS (+)   Anesthesia Other Findings   Reproductive/Obstetrics (+) Pregnancy                           Anesthesia Physical Anesthesia Plan  ASA: II  Anesthesia Plan: Epidural   Post-op Pain Management:    Induction:   Airway Management Planned:   Additional Equipment:   Intra-op Plan:   Post-operative Plan:   Informed Consent: I have reviewed the patients History and Physical, chart, labs and discussed the procedure including the risks, benefits and alternatives for the proposed anesthesia with the patient or authorized representative who has indicated his/her understanding and acceptance.     Plan Discussed with:   Anesthesia Plan Comments:         Anesthesia Quick Evaluation

## 2013-01-07 NOTE — Progress Notes (Signed)
Carrie Rogers is a 22 y.o. G3P2002 at [redacted]w[redacted]d by ultrasound admitted for induction of labor due to Post dates. .  Subjective: Comfortable with epidural   Objective: BP 114/50  Pulse 71  Temp 98 F (36.7 C) (Oral)  Resp 18  Ht 5\' 4"  (1.626 m)  Wt 181 lb (82.101 kg)  BMI 31.07 kg/m2  SpO2 100%  LMP 04/03/2012  Breastfeeding? Unknown      FHT:  FHR: 145 bpm, variability: moderate,  accelerations:  Present,  decelerations:  Absent UC:   regular, every 2 minutes SVE:   Dilation: 5 Effacement (%): 70 Station: -2 Exam by:: Carrie Evans stout RN  Labs: Lab Results  Component Value Date   WBC 8.3 01/07/2013   HGB 11.3* 01/07/2013   HCT 33.8* 01/07/2013   MCV 90.6 01/07/2013   PLT 182 01/07/2013    Assessment / Plan: Induction of labor due to postterm,  progressing well on pitocin  Labor: Progressing normally and Progressing on Pitocin, will continue to increase then AROM Preeclampsia:   Fetal Wellbeing:  Category I Pain Control:  Epidural I/D:  n/a Anticipated MOD:  NSVD  Carrie Rogers 01/07/2013, 6:52 PM

## 2013-01-08 NOTE — Clinical Social Work Note (Signed)
CSW reviewed MOB's chart.  MOB is currently treating depression with medication management (zoloft).  No reported current concerns with depression symptoms.  Please reconsult CSW if concerns or further needs arise.   Patient was referred for history of depression/anxiety.  * Referral screened out by Clinical Social Worker because none of the following criteria appear to apply: ~ History of anxiety/depression during this pregnancy, or of post-partum depression. ~ Diagnosis of anxiety and/or depression within last 3 years ~ History of depression due to pregnancy loss/loss of child  OR  * Patient's symptoms currently being treated with medication and/or therapy.  Please contact the Clinical Social Worker if needs arise, or by the patient's request.  [phone]  

## 2013-01-08 NOTE — Anesthesia Postprocedure Evaluation (Signed)
Anesthesia Post Note  Patient: Carrie Rogers  Procedure(s) Performed: * No procedures listed *  Anesthesia type: Epidural  Patient location: Mother/Baby  Post pain: Pain level controlled  Post assessment: Post-op Vital signs reviewed  Last Vitals:  Filed Vitals:   01/08/13 0400  BP: 99/60  Pulse: 67  Temp: 37.1 C  Resp: 18    Post vital signs: Reviewed  Level of consciousness:alert  Complications: No apparent anesthesia complications

## 2013-01-08 NOTE — Progress Notes (Signed)
Post Partum Day #1 Subjective: no complaints and up ad lib; breastfeeding going well; desires Mirena for contraception  Objective: Blood pressure 99/60, pulse 67, temperature 98.7 F (37.1 C), temperature source Oral, resp. rate 18, height 5\' 4"  (1.626 m), weight 181 lb (82.101 kg), last menstrual period 04/03/2012, SpO2 98.00%, unknown if currently breastfeeding.  Physical Exam:  General: alert, cooperative and no distress Lochia: appropriate Uterine Fundus: firm DVT Evaluation: No evidence of DVT seen on physical exam.   Basename 01/07/13 0830  HGB 11.3*  HCT 33.8*    Assessment/Plan: Plan for discharge tomorrow   LOS: 1 day   Siddh Vandeventer 01/08/2013, 7:21 AM

## 2013-01-09 MED ORDER — SENNOSIDES-DOCUSATE SODIUM 8.6-50 MG PO TABS: 2.0000 | ORAL_TABLET | Freq: Every day | ORAL | Status: DC

## 2013-01-09 MED ORDER — IBUPROFEN 600 MG PO TABS: 600.0000 mg | ORAL_TABLET | Freq: Four times a day (QID) | ORAL | Status: DC

## 2013-01-09 NOTE — Discharge Summary (Signed)
Agree with above note.  Carrie Rogers H. 01/09/2013 1:25 PM

## 2013-01-09 NOTE — Discharge Summary (Signed)
Obstetric Discharge Summary Reason for Admission: induction of labor for post-dates with pitocin Prenatal Procedures: none Intrapartum Procedures: spontaneous vaginal delivery Postpartum Procedures: none Complications-Operative and Postpartum: 1st degree perineal laceration Hemoglobin  Date Value Range Status  01/07/2013 11.3* 12.0 - 15.0 g/dL Final     HCT  Date Value Range Status  01/07/2013 33.8* 36.0 - 46.0 % Final    Physical Exam:  Filed Vitals:   01/09/13 0424  BP: 102/61  Pulse: 73  Temp: 98 F (36.7 C)  Resp: 18    General: alert, cooperative, appears stated age and no distress Lochia: appropriate Uterine Fundus: firm Incision: NA DVT Evaluation: No evidence of DVT seen on physical exam. Negative Homan's sign.  Discharge Diagnoses: Post-date pregnancy  Discharge Information: Date: 01/09/2013 Activity: pelvic rest Diet: routine Medications: PNV, Ibuprofen, Colace and Iron Condition: stable Instructions: refer to practice specific booklet Discharge to: home Follow-up Information    Follow up with [facility]. Schedule an appointment as soon as possible for a visit in 6 weeks.       Contraception: Mirena  Newborn Data: Live born female  Birth Weight: 8 lb 7.6 oz (3844 g) APGAR: 8, 9  Home with mother.  Carrie Rogers 01/09/2013, 7:35 AM  I examined pt and agree with documentation above and resident plan of care. [facility]

## 2013-01-10 NOTE — Progress Notes (Signed)
Post discharge chart review completed.  

## 2013-01-12 ENCOUNTER — Encounter: Payer: Self-pay | Admitting: Obstetrics and Gynecology

## 2013-02-07 ENCOUNTER — Encounter: Payer: Self-pay | Admitting: Obstetrics and Gynecology

## 2013-02-07 ENCOUNTER — Other Ambulatory Visit: Payer: Self-pay | Admitting: Obstetrics and Gynecology

## 2013-02-07 ENCOUNTER — Ambulatory Visit (INDEPENDENT_AMBULATORY_CARE_PROVIDER_SITE_OTHER): Payer: Medicaid Other | Admitting: Obstetrics and Gynecology

## 2013-02-07 MED ORDER — LEVONORGESTREL 20 MCG/24HR IU IUD
INTRAUTERINE_SYSTEM | Freq: Once | INTRAUTERINE | Status: AC
  Administered 2013-02-07: 15:00:00 via INTRAUTERINE

## 2013-02-07 NOTE — Progress Notes (Addendum)
  Subjective:     Carrie Rogers is a 22 y.o. female who presents for a postpartum visit. She is 1 month postpartum following a spontaneous vaginal delivery. I have fully reviewed the prenatal and intrapartum course. The delivery was at 40.5 gestational weeks. Outcome: spontaneous vaginal delivery. Anesthesia: epidural. Postpartum course has been uncomplicated. Baby's course has been uncomplicated. Baby is feeding by breast. Bleeding no bleeding. Bowel function is normal. Bladder function is normal. Patient is not sexually active. Contraception method is abstinence. Postpartum depression screening: negative. Has helped with the baby and lives with her mother and stepfather. Plan is to look for a job in the next week.h  The following portions of the patient's history were reviewed and updated as appropriate: allergies, current medications, past family history, past medical history, past social history, past surgical history and problem list.  Review of Systems Pertinent items are noted in HPI.   Objective:    BP 110/81  Pulse 84  Temp(Src) 98 F (36.7 C)  Ht 5\' 4"  (1.626 m)  Wt 165 lb 9.6 oz (75.116 kg)  BMI 28.41 kg/m2  Breastfeeding? Yes  General:  alert, cooperative and no distress   Breasts:  inspection negative, no nipple discharge or bleeding, no masses or nodularity palpable and lactational  Lungs: clear to auscultation bilaterally  Heart:  regular rate and rhythm, S1, S2 normal, no murmur, click, rub or gallop  Abdomen: soft, non-tender; bowel sounds normal; no masses,  no organomegaly   Vulva:  normal  Vagina: normal vagina, no discharge, exudate, lesion, or erythema  Cervix:  multiparous appearance  Corpus: normal size, contour, position, consistency, mobility, non-tender  Adnexa:  no mass, fullness, tenderness  Rectal Exam: Not performed.      UPT negative  Procedure note Informed consent obtained. Procedure risks (including pain, bleeding , uterine perforation, infection)  and benefits reviewed.  Questions answered and time out performed.  With the patient in dorsal lithotomy position bimanual performed. Cervix brought into view with speculum and cleansed x3 with Betadine swab. Single toothed tenaculum placed at 10 and 2 o'clock.Sound placed through cervix to 8.5 cm length. Mirena IUD inserted without difficulty. Strings cut to 3 cm length. Pt tolerated the procedure very well  Assessment:    4 wk postpartum exam doing well. Mirena IUD placed Pap smear not done at today's visit.   Plan:    1. Contraception: IUD 2. Counseled on IUD and she elects to call for string check appointment if she cannot do it herself or has any problem 3. Follow up in: 8 months for Pap or as needed.

## 2013-02-07 NOTE — Addendum Note (Signed)
Addended by: Gerome Apley on: 02/07/2013 02:58 PM   Modules accepted: Orders

## 2013-02-07 NOTE — Patient Instructions (Signed)
Intrauterine Device Information  An intrauterine device (IUD) is inserted into your uterus and prevents pregnancy. There are 2 types of IUDs available:  · Copper IUD. This type of IUD is wrapped in copper wire and is placed inside the uterus. Copper makes the uterus and fallopian tubes produce a fluid that kills sperm. The copper IUD can stay in place for 10 years.  · Hormone IUD. This type of IUD contains the hormone progestin (synthetic progesterone). The hormone thickens the cervical mucus and prevents sperm from entering the uterus, and it also thins the uterine lining to prevent implantation of a fertilized egg. The hormone can weaken or kill the sperm that get into the uterus. The hormone IUD can stay in place for 5 years.  Your caregiver will make sure you are a good candidate for a contraceptive IUD. Discuss with your caregiver the possible side effects.  ADVANTAGES  · It is highly effective, reversible, long-acting, and low maintenance.  · There are no estrogen-related side effects.  · An IUD can be used when breastfeeding.  · It is not associated with weight gain.  · It works immediately after insertion.  · The copper IUD does not interfere with your female hormones.  · The progesterone IUD can make heavy menstrual periods lighter.  · The progesterone IUD can be used for 5 years.  · The copper IUD can be used for 10 years.  DISADVANTAGES  · The progesterone IUD can be associated with irregular bleeding patterns.  · The copper IUD can make your menstrual flow heavier and more painful.  · You may experience cramping and vaginal bleeding after insertion.  Document Released: 10/28/2004 Document Revised: 02/16/2012 Document Reviewed: 03/29/2011  Exit[employer]® Patient Information ©2013 [employer].

## 2014-03-05 IMAGING — US US FETAL BPP W/O NONSTRESS
1 series · 14 of 15 positions shown · non-contrast
Comparison: none

[Series 1: us fetal bpp w/o nonstress · non-contrast · 15 acquisitions, 14 frames shown]
[im 1/15]
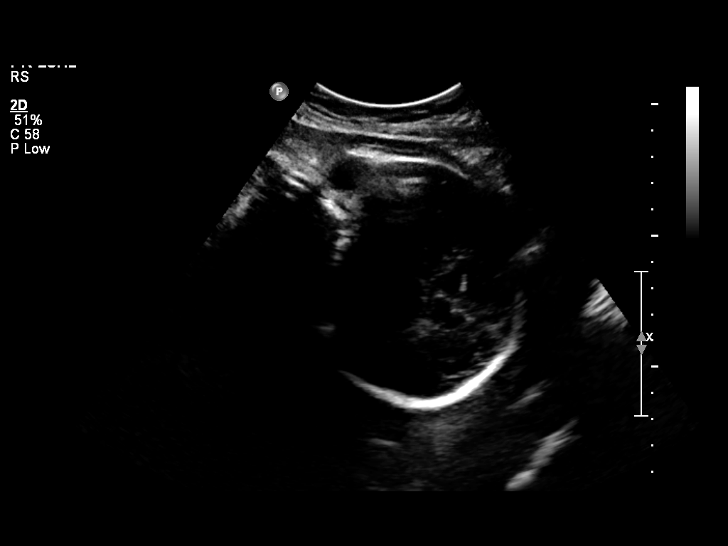
[im 2/15]
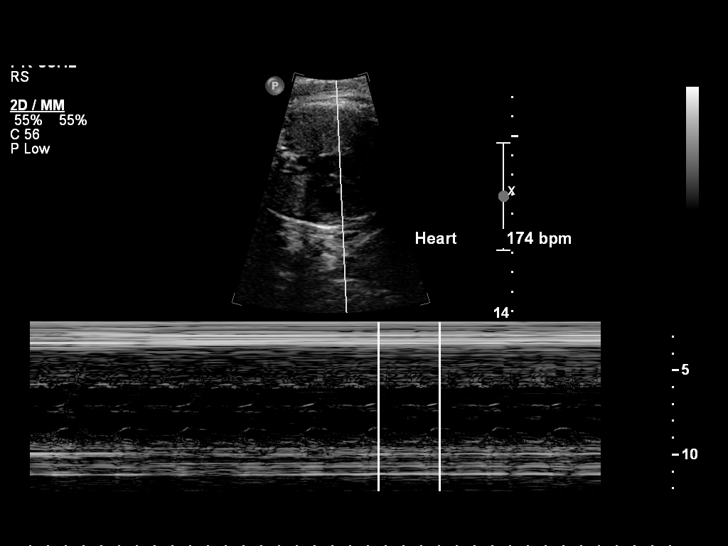
[im 3/15]
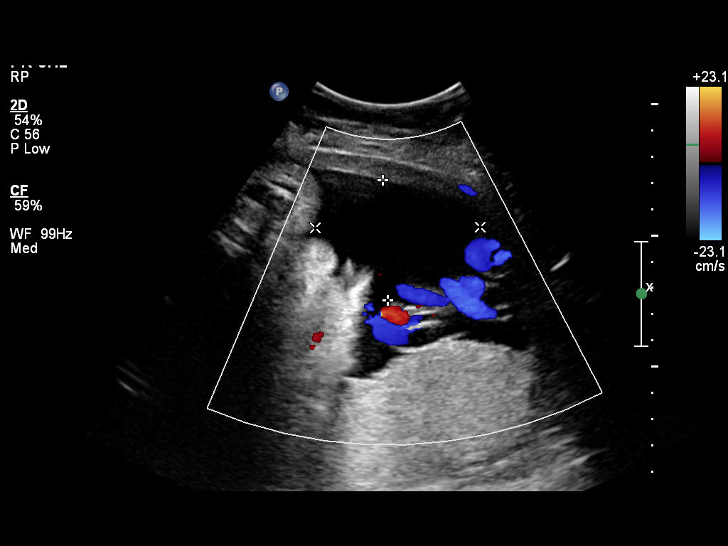
[im 4/15]
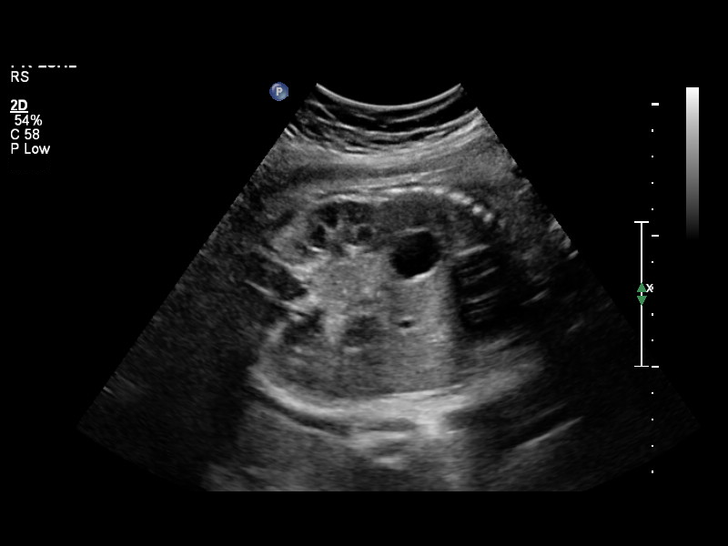
[im 5/15]
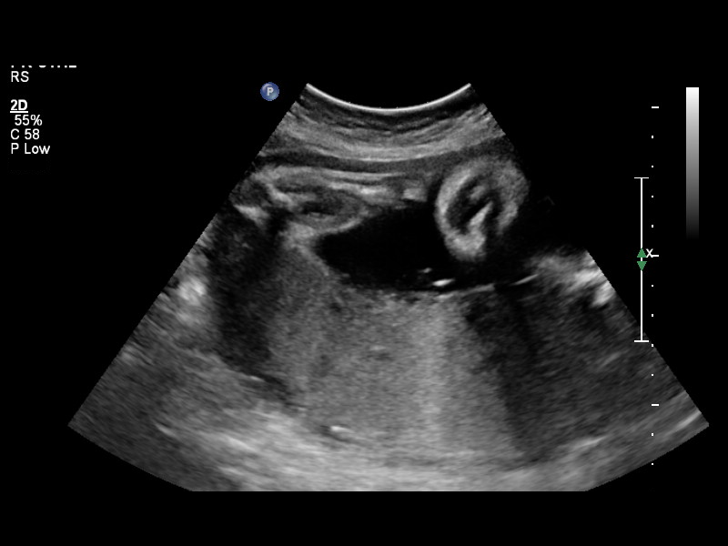
[im 6/15]
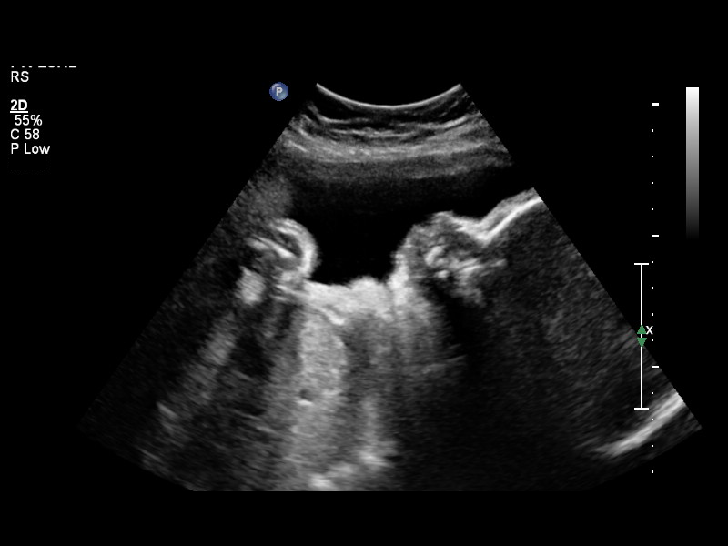
[im 7/15]
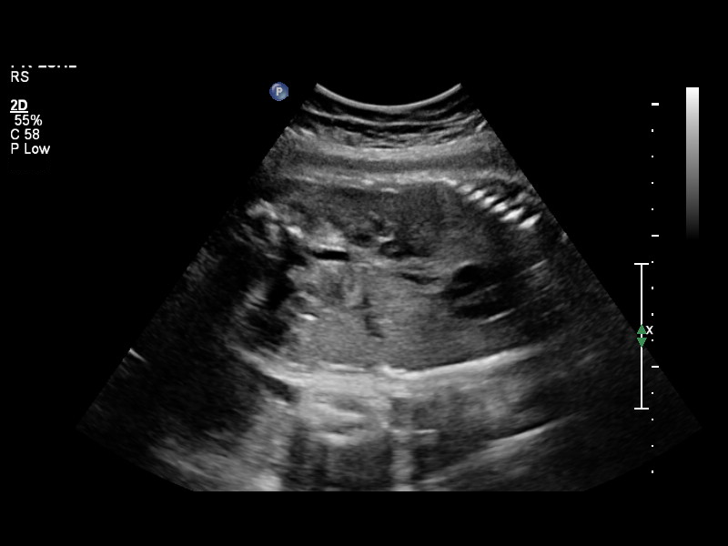
[im 9/15]
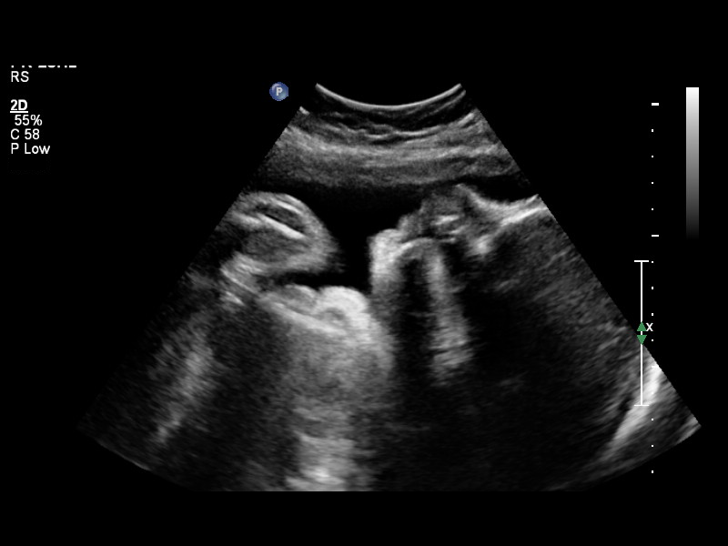
[im 10/15]
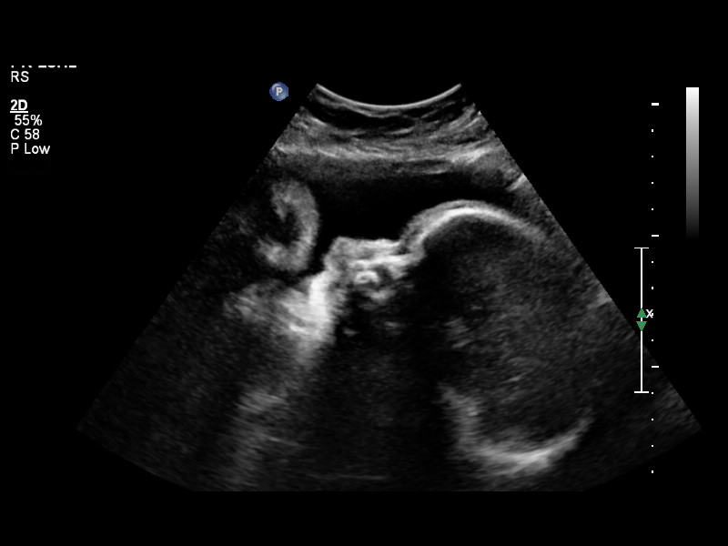
[im 11/15]
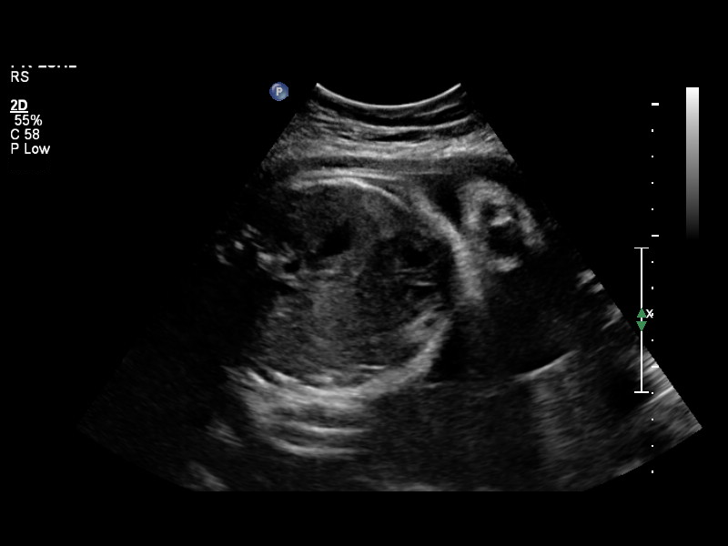
[im 12/15]
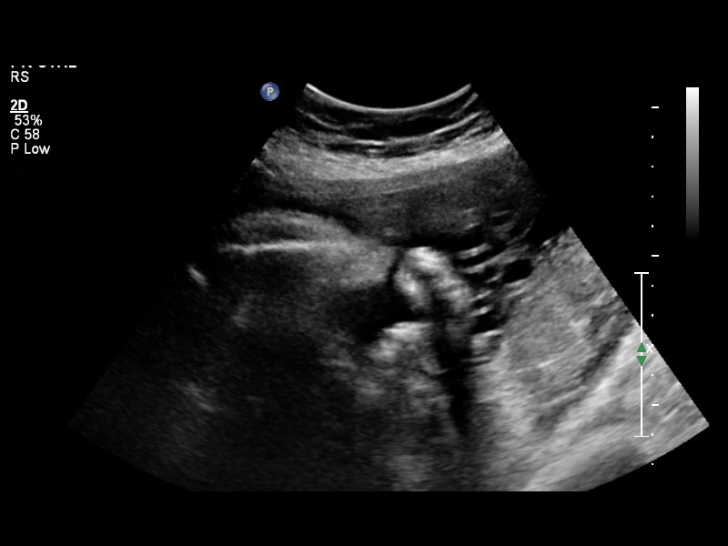
[im 13/15]
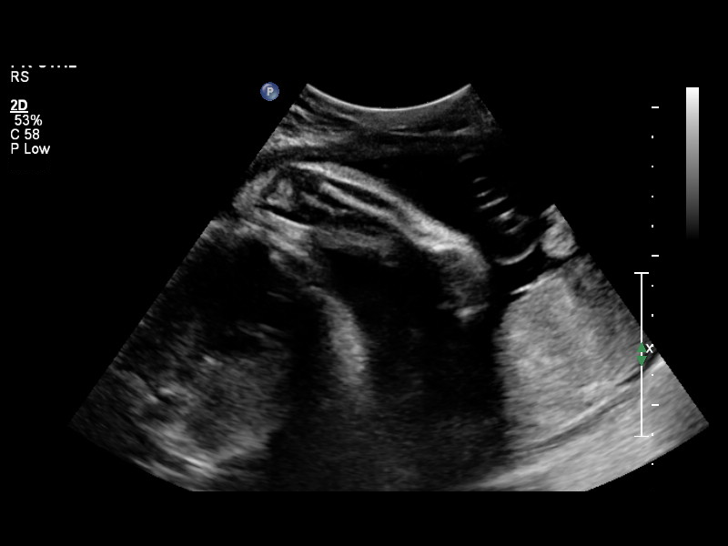
[im 14/15]
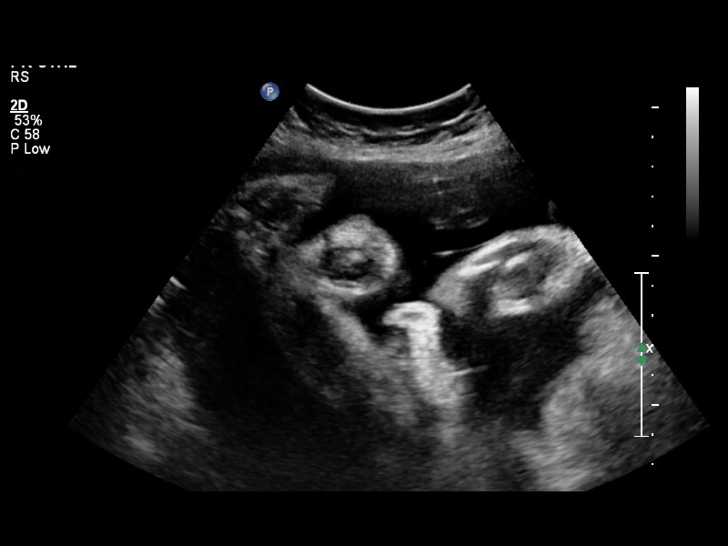
[im 15/15]
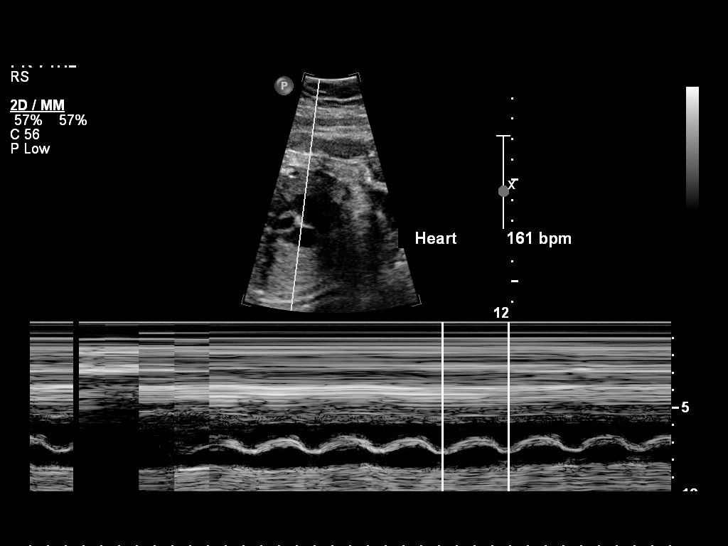

[14 of 15 positions shown; findings below may reference images not displayed]

OBSTETRICS REPORT
                      (Signed Final 11/17/2012 [DATE])

Service(s) Provided

Indications

 Decreased fetal movement
 Non-reactive NST
Fetal Evaluation

 Num Of Fetuses:    1
 Fetal Heart Rate:  174                         bpm
 Cardiac Activity:  Observed
 Presentation:      Cephalic

 Comment:    BPP [DATE] in 14 minutes.

 Amniotic Fluid
 AFI FV:      Subjectively within normal limits
                                             Larg Pckt:     4.5  cm
Biophysical Evaluation

 Amniotic F.V:   Within normal limits       F. Tone:        Observed
 F. Movement:    Observed                   Score:          [DATE]
 F. Breathing:   Observed
Gestational Age

 Clinical EDD:  33w 3d                                        EDD:   01/02/13
 Best:          33w 3d    Det. By:   Clinical EDD             EDD:   01/02/13
Impression

 Biophysical profile score of [DATE].

 questions or concerns.

## 2014-10-09 ENCOUNTER — Encounter: Payer: Self-pay | Admitting: Obstetrics and Gynecology

## 2015-08-19 ENCOUNTER — Emergency Department (HOSPITAL_COMMUNITY)
Admission: EM | Admit: 2015-08-19 | Discharge: 2015-08-19 | Disposition: A | Discharge status: Home / Self Care | Payer: Medicaid Other | Attending: Emergency Medicine | Admitting: Emergency Medicine

## 2015-08-19 ENCOUNTER — Encounter (HOSPITAL_COMMUNITY): Payer: Self-pay | Admitting: *Deleted

## 2015-08-19 DIAGNOSIS — J0110 Acute frontal sinusitis, unspecified: Secondary | ICD-10-CM | POA: Insufficient documentation

## 2015-08-19 DIAGNOSIS — R0981 Nasal congestion: Secondary | ICD-10-CM | POA: Diagnosis present

## 2015-08-19 DIAGNOSIS — Z79899 Other long term (current) drug therapy: Secondary | ICD-10-CM | POA: Diagnosis not present

## 2015-08-19 DIAGNOSIS — Z720 Tobacco use: Secondary | ICD-10-CM | POA: Insufficient documentation

## 2015-08-19 DIAGNOSIS — F329 Major depressive disorder, single episode, unspecified: Secondary | ICD-10-CM | POA: Insufficient documentation

## 2015-08-19 DIAGNOSIS — H53149 Visual discomfort, unspecified: Secondary | ICD-10-CM | POA: Insufficient documentation

## 2015-08-19 MED ORDER — FLUTICASONE PROPIONATE 50 MCG/ACT NA SUSP: 2.0000 | Freq: Every day | NASAL | Status: DC

## 2015-08-19 MED ORDER — KETOROLAC TROMETHAMINE 60 MG/2ML IM SOLN
60.0000 mg | Freq: Once | INTRAMUSCULAR | Status: AC
  Administered 2015-08-19: 60 mg via INTRAMUSCULAR
  Filled 2015-08-19: qty 2

## 2015-08-19 MED ORDER — AMOXICILLIN 500 MG PO CAPS: 500.0000 mg | ORAL_CAPSULE | Freq: Three times a day (TID) | ORAL | Status: DC

## 2015-08-19 MED ORDER — PSEUDOEPHEDRINE HCL 30 MG PO TABS: 30.0000 mg | ORAL_TABLET | ORAL | Status: DC | PRN

## 2015-08-19 MED ORDER — IBUPROFEN 600 MG PO TABS: 600.0000 mg | ORAL_TABLET | Freq: Four times a day (QID) | ORAL | Status: DC | PRN

## 2015-08-19 NOTE — ED Notes (Signed)
Pt reports nasal congestion with h/a generalized body aches x 1 week.  Has tried OTC meds without relief.

## 2015-08-19 NOTE — Discharge Instructions (Signed)
Ibuprofen for pain. Take Flonase as prescribed daily. Sudafed regularly for sinus congestion. Use saline intranasally to washout sinuses as well. Amoxicillin as prescribed until all gone. Get plenty of rest. Follow with primary care doctor.   Sinusitis Sinusitis is redness, soreness, and inflammation of the paranasal sinuses. Paranasal sinuses are air pockets within the bones of your face (beneath the eyes, the middle of the forehead, or above the eyes). In healthy paranasal sinuses, mucus is able to drain out, and air is able to circulate through them by way of your nose. However, when your paranasal sinuses are inflamed, mucus and air can become trapped. This can allow bacteria and other germs to grow and cause infection. Sinusitis can develop quickly and last only a short time (acute) or continue over a long period (chronic). Sinusitis that lasts for more than 12 weeks is considered chronic.  CAUSES  Causes of sinusitis include:  Allergies.  Structural abnormalities, such as displacement of the cartilage that separates your nostrils (deviated septum), which can decrease the air flow through your nose and sinuses and affect sinus drainage.  Functional abnormalities, such as when the small hairs (cilia) that line your sinuses and help remove mucus do not work properly or are not present. SIGNS AND SYMPTOMS  Symptoms of acute and chronic sinusitis are the same. The primary symptoms are pain and pressure around the affected sinuses. Other symptoms include:  Upper toothache.  Earache.  Headache.  Bad breath.  Decreased sense of smell and taste.  A cough, which worsens when you are lying flat.  Fatigue.  Fever.  Thick drainage from your nose, which often is green and may contain pus (purulent).  Swelling and warmth over the affected sinuses. DIAGNOSIS  Your health care provider will perform a physical exam. During the exam, your health care provider may:  Look in your nose for  signs of abnormal growths in your nostrils (nasal polyps).  Tap over the affected sinus to check for signs of infection.  View the inside of your sinuses (endoscopy) using an imaging device that has a light attached (endoscope). If your health care provider suspects that you have chronic sinusitis, one or more of the following tests may be recommended:  Allergy tests.  Nasal culture. A sample of mucus is taken from your nose, sent to a lab, and screened for bacteria.  Nasal cytology. A sample of mucus is taken from your nose and examined by your health care provider to determine if your sinusitis is related to an allergy. TREATMENT  Most cases of acute sinusitis are related to a viral infection and will resolve on their own within 10 days. Sometimes medicines are prescribed to help relieve symptoms (pain medicine, decongestants, nasal steroid sprays, or saline sprays).  However, for sinusitis related to a bacterial infection, your health care provider will prescribe antibiotic medicines. These are medicines that will help kill the bacteria causing the infection.  Rarely, sinusitis is caused by a fungal infection. In theses cases, your health care provider will prescribe antifungal medicine. For some cases of chronic sinusitis, surgery is needed. Generally, these are cases in which sinusitis recurs more than 3 times per year, despite other treatments. HOME CARE INSTRUCTIONS   Drink plenty of water. Water helps thin the mucus so your sinuses can drain more easily.  Use a humidifier.  Inhale steam 3 to 4 times a day (for example, sit in the bathroom with the shower running).  Apply a warm, moist washcloth to your face  3 to 4 times a day, or as directed by your health care provider.  Use saline nasal sprays to help moisten and clean your sinuses.  Take medicines only as directed by your health care provider.  If you were prescribed either an antibiotic or antifungal medicine, finish it all  even if you start to feel better. SEEK IMMEDIATE MEDICAL CARE IF:  You have increasing pain or severe headaches.  You have nausea, vomiting, or drowsiness.  You have swelling around your face.  You have vision problems.  You have a stiff neck.  You have difficulty breathing. MAKE SURE YOU:   Understand these instructions.  Will watch your condition.  Will get help right away if you are not doing well or get worse. Document Released: 11/24/2005 Document Revised: 04/10/2014 Document Reviewed: 12/09/2011 [facility] Patient Information 2015 [place], [place]. This information is not intended to replace advice given to you by your health care provider. Make sure you discuss any questions you have with your health care provider.

## 2015-08-19 NOTE — ED Provider Notes (Signed)
CSN: [id number]     Arrival date & time 08/19/15  1747 History  This chart was scribed for non-physician provider Jaynie Crumble, PA-C, working with Lorre Nick, MD by Phillis Haggis, ED Scribe. This patient was seen in room WTR5/WTR5 and patient care was started at 7:04 PM.   Chief Complaint  Patient presents with  . Nasal Congestion  . Headache   The history is provided by the patient. No language interpreter was used.  HPI Comments: Carrie Rogers is a 24 y.o. female who presents to the Emergency Department complaining of gradually worsening thick nasal congestion onset one week ago. Pt reports associated myalgias, fatigue, and constant, pressured frontal headache with photophobia and eye pain. States that the headache sometimes feels sinus related, and will feel like a regular headache other times. Pt reports worsening congestion and cough with laying down and lots of movement, and states congestion will relieve with sitting up straight. Pt reports using OTC medications, such as allergy medication and decongestants, to no relief. Pt denies hitting head, fever, chills, otalgia, sore throat, or arthralgias. Pt denies significant medical history.   Past Medical History  Diagnosis Date  . No pertinent past medical history   . Depression    Past Surgical History  Procedure Laterality Date  . No past surgeries     Family History  Problem Relation Age of Onset  . Other Neg Hx   . Diabetes Father   . Heart disease Father    Social History  Substance Use Topics  . Smoking status: Current Every Day Smoker -- 0.25 packs/day    Types: Cigarettes  . Smokeless tobacco: Never Used  . Alcohol Use: 0.6 oz/week    1 Glasses of wine per week   OB History    Gravida Para Term Preterm AB TAB SAB Ectopic Multiple Living   3 3 3  0 0 0 0 0 0 3     Review of Systems  Constitutional: Positive for fatigue. Negative for fever and chills.  HENT: Positive for congestion. Negative for ear pain.    Eyes: Positive for photophobia.  Musculoskeletal: Positive for myalgias. Negative for arthralgias.  Neurological: Positive for headaches.   Allergies  Review of patient's allergies indicates no known allergies.  Home Medications   Prior to Admission medications   Medication Sig Start Date End Date Taking? Authorizing Provider  Prenatal Vit-Fe Fumarate-FA (MULTIVITAMIN-PRENATAL) 27-0.8 MG TABS Take 1 tablet by mouth daily.    Historical Provider, MD  sertraline (ZOLOFT) 50 MG tablet Take 50 mg by mouth daily.    Historical Provider, MD   BP 121/76 mmHg  Pulse 99  Temp(Src) 98.4 F (36.9 C) (Oral)  Resp 16  SpO2 100%  LMP 08/05/2015  Physical Exam  Constitutional: She is oriented to person, place, and time. She appears well-developed and well-nourished.  HENT:  Head: Normocephalic and atraumatic.  Right Ear: Tympanic membrane, external ear and ear canal normal.  Left Ear: External ear and ear canal normal.  Nose: Mucosal edema and rhinorrhea present. Right sinus exhibits maxillary sinus tenderness and frontal sinus tenderness. Left sinus exhibits maxillary sinus tenderness and frontal sinus tenderness.  Mouth/Throat: Uvula is midline, oropharynx is clear and moist and mucous membranes are normal. No oropharyngeal exudate.  Eyes: Conjunctivae and EOM are normal.  Neck: Normal range of motion. Neck supple.  Cardiovascular: Normal rate, regular rhythm and normal heart sounds.   Pulmonary/Chest: Effort normal and breath sounds normal. No respiratory distress. She has no wheezes. She  has no rales.  Musculoskeletal: Normal range of motion.  Neurological: She is alert and oriented to person, place, and time.  Skin: Skin is warm and dry.  Psychiatric: She has a normal mood and affect. Her behavior is normal.  Nursing note and vitals reviewed.  ED Course  Procedures (including critical care time) DIAGNOSTIC STUDIES: Oxygen Saturation is 100% on RA, normal by my interpretation.     COORDINATION OF CARE: 7:07 PM-Discussed treatment plan which includes Sudafed, Flonase,saline nasal spray, work note and Toradol shot with pt at bedside and pt agreed to plan.   Labs Review Labs Reviewed - No data to display  Imaging Review No results found.    EKG Interpretation None      MDM   Final diagnoses:  Acute frontal sinusitis, recurrence not specified    Patient with frontal headache for a week, gradual in onset, nasal congestion, thick nasal drainage. She is afebrile, nontoxic appearing. Symptoms for a week, will give amoxicillin for probable sinusitis. Portal 60 mg given IM in emergency department. We will also start on Flonase, Sudafed for congestion. Instructed to use intranasal saline every few hours. Follow-up with primary care doctor. No evidence of trauma, normal examination otherwise, patient otherwise healthy with no medical conditions.  Filed Vitals:   08/19/15 1813  BP: 121/76  Pulse: 99  Temp: 98.4 F (36.9 C)  TempSrc: Oral  Resp: 16  SpO2: 100%   I personally performed the services described in this documentation, which was scribed in my presence. The recorded information has been reviewed and is accurate.   Jaynie Crumble, PA-C 08/19/15 1915  Lorre Nick, MD 08/19/15 2325

## 2016-09-08 ENCOUNTER — Emergency Department (HOSPITAL_COMMUNITY): Payer: Medicaid Other

## 2016-09-08 ENCOUNTER — Emergency Department (HOSPITAL_COMMUNITY)
Admission: EM | Admit: 2016-09-08 | Discharge: 2016-09-08 | Disposition: A | Discharge status: Home / Self Care | Payer: Medicaid Other | Attending: Emergency Medicine | Admitting: Emergency Medicine

## 2016-09-08 ENCOUNTER — Encounter (HOSPITAL_COMMUNITY): Payer: Self-pay | Admitting: Emergency Medicine

## 2016-09-08 DIAGNOSIS — M7750 Other enthesopathy of unspecified foot: Secondary | ICD-10-CM

## 2016-09-08 DIAGNOSIS — M79671 Pain in right foot: Secondary | ICD-10-CM | POA: Diagnosis present

## 2016-09-08 DIAGNOSIS — M779 Enthesopathy, unspecified: Secondary | ICD-10-CM | POA: Insufficient documentation

## 2016-09-08 DIAGNOSIS — F17210 Nicotine dependence, cigarettes, uncomplicated: Secondary | ICD-10-CM | POA: Insufficient documentation

## 2016-09-08 MED ORDER — TRAMADOL HCL 50 MG PO TABS: 50.0000 mg | ORAL_TABLET | Freq: Four times a day (QID) | ORAL | 0 refills | Status: DC | PRN

## 2016-09-08 MED ORDER — IBUPROFEN 800 MG PO TABS: 800.0000 mg | ORAL_TABLET | Freq: Three times a day (TID) | ORAL | 0 refills | Status: DC | PRN

## 2016-09-08 NOTE — ED Notes (Signed)
Patient was alert, oriented and stable upon discharge. RN went over AVS and patient had no further questions.  

## 2016-09-08 NOTE — Discharge Instructions (Signed)
Ice and elevate your foot.  Follow-up with the orthopedist provided

## 2016-09-10 NOTE — ED Provider Notes (Signed)
[facility]-EMERGENCY DEPT Provider Note   CSN: [id number] Arrival date & time: 09/08/16  2112     History   Chief Complaint Chief Complaint  Patient presents with  . Foot Pain    left    HPI Carrie Rogers is a 25 y.o. female.  HPI Patient presents to the emergency department with left foot pain that started a week.  The patient states she walks a lot at work on hard concrete many stairs and states that her foot started hurting over the midportion of the top of her foot.  Patient states she does not recall any injury to the area.  She states she did take some Tylenol at home without significant relief of her symptoms.  She states movement and palpation makes the pain worse Past Medical History:  Diagnosis Date  . Depression   . No pertinent past medical history     Patient Active Problem List   Diagnosis Date Noted  . Late prenatal care 01/05/2013    Past Surgical History:  Procedure Laterality Date  . NO PAST SURGERIES      OB History    Gravida Para Term Preterm AB Living   3 3 3  0 0 3   SAB TAB Ectopic Multiple Live Births   0 0 0 0 1       Home Medications    Prior to Admission medications   Medication Sig Start Date End Date Taking? Authorizing Provider  amoxicillin (AMOXIL) 500 MG capsule Take 1 capsule (500 mg total) by mouth 3 (three) times daily. 08/19/15   Tatyana Kirichenko, PA-C  fluticasone (FLONASE) 50 MCG/ACT nasal spray Place 2 sprays into both nostrils daily. 08/19/15   Tatyana Kirichenko, PA-C  ibuprofen (ADVIL,MOTRIN) 800 MG tablet Take 1 tablet (800 mg total) by mouth every 8 (eight) hours as needed. 09/08/16   Charlestine Nighthristopher Raechell Singleton, PA-C  Prenatal Vit-Fe Fumarate-FA (MULTIVITAMIN-PRENATAL) 27-0.8 MG TABS Take 1 tablet by mouth daily.    Historical Provider, MD  pseudoephedrine (SUDAFED) 30 MG tablet Take 1 tablet (30 mg total) by mouth every 4 (four) hours as needed for congestion. 08/19/15   Tatyana Kirichenko, PA-C  sertraline (ZOLOFT) 50 MG tablet  Take 50 mg by mouth daily.    Historical Provider, MD  traMADol (ULTRAM) 50 MG tablet Take 1 tablet (50 mg total) by mouth every 6 (six) hours as needed for severe pain. 09/08/16   Charlestine Nighthristopher Darnel Mchan, PA-C    Family History Family History  Problem Relation Age of Onset  . Diabetes Father   . Heart disease Father   . Other Neg Hx     Social History Social History  Substance Use Topics  . Smoking status: Current Every Day Smoker    Packs/day: 0.25    Types: Cigarettes  . Smokeless tobacco: Never Used  . Alcohol use 0.6 oz/week    1 Glasses of wine per week     Allergies   Review of patient's allergies indicates no known allergies.   Review of Systems Review of Systems All other systems negative except as documented in the HPI. All pertinent positives and negatives as reviewed in the HPI.  Physical Exam Updated Vital Signs BP 129/73 (BP Location: Left Arm)   Pulse 70   Temp 99 F (37.2 C) (Oral)   Resp 16   SpO2 100%   Physical Exam  Constitutional: She is oriented to person, place, and time. She appears well-developed and well-nourished.  HENT:  Head: Normocephalic and atraumatic.  Eyes:  Pupils are equal, round, and reactive to light.  Pulmonary/Chest: Effort normal.  Musculoskeletal:       Feet:  Neurological: She is alert and oriented to person, place, and time.  Skin: Skin is warm and dry.     ED Treatments / Results  Labs (all labs ordered are listed, but only abnormal results are displayed) Labs Reviewed - No data to display  EKG  EKG Interpretation None       Radiology Dg Foot Complete Left  Result Date: 09/08/2016 CLINICAL DATA:  Anterior foot pain since September 29 with pain across all metatarsals radiating to the bottom of the foot. EXAM: LEFT FOOT - COMPLETE 3+ VIEW COMPARISON:  None. FINDINGS: There is no evidence of fracture or dislocation. There is no evidence of arthropathy or other focal bone abnormality. Soft tissues are  unremarkable. IMPRESSION: No acute osseous abnormality. Electronically Signed   By: Tollie Eth M.D.   On: 09/08/2016 22:55    Procedures Procedures (including critical care time)  Medications Ordered in ED Medications - No data to display   Initial Impression / Assessment and Plan / ED Course  I have reviewed the triage vital signs and the nursing notes.  Pertinent labs & imaging results that were available during my care of the patient were reviewed by me and considered in my medical decision making (see chart for details).  Clinical Course    History be treated for tendinitis of the foot.  Referred to orthopedics.  Told ice and elevate her foot.  Told to return here as needed.  She has also place an Ace wrap and a postop shoe  Final Clinical Impressions(s) / ED Diagnoses   Final diagnoses:  Tendonitis of foot    New Prescriptions Discharge Medication List as of 09/08/2016 11:17 PM    START taking these medications   Details  traMADol (ULTRAM) 50 MG tablet Take 1 tablet (50 mg total) by mouth every 6 (six) hours as needed for severe pain., Starting Mon 09/08/2016, [employer], PA-C 09/10/16 1610    Mancel Bale, MD 09/10/16 [phone]

## 2016-09-11 ENCOUNTER — Ambulatory Visit (INDEPENDENT_AMBULATORY_CARE_PROVIDER_SITE_OTHER): Payer: Medicaid Other | Admitting: Orthopedic Surgery

## 2016-09-11 DIAGNOSIS — M79672 Pain in left foot: Secondary | ICD-10-CM | POA: Diagnosis not present

## 2016-10-09 ENCOUNTER — Encounter (INDEPENDENT_AMBULATORY_CARE_PROVIDER_SITE_OTHER): Payer: Self-pay | Admitting: Orthopedic Surgery

## 2016-10-09 ENCOUNTER — Ambulatory Visit (INDEPENDENT_AMBULATORY_CARE_PROVIDER_SITE_OTHER): Payer: Medicaid Other | Admitting: Orthopedic Surgery

## 2016-10-09 DIAGNOSIS — M7742 Metatarsalgia, left foot: Secondary | ICD-10-CM

## 2016-10-09 NOTE — Progress Notes (Signed)
Office Visit Note   Patient: Carrie Rogers           Date of Birth: 04-08-1991           MRN: [id number] Visit Date: 10/09/2016 Requested by: Catalina AntiguaPeggy Constant, MD [address] [place], [place] [id number] PCP: Catalina AntiguaONSTANT,PEGGY, MD  Subjective: Chief Complaint  Patient presents with  . Left Foot - Follow-up, Pain  Carrie ParodyCaitlin is a 25 year old patient whom I saw a month ago.  She is having left foot pain primarily in the metatarsal head regions.  Radiographs are normal.  She's been in a fracture boot and in general is doing better.  Still has a dull pressure carotid type pain as opposed to the severe pain.  Patient returns in followup for left foot pain, onset 09/05/16.  NKI.  Has been wearing the fracture boot, WBAT.  Taking ibuprofen prn.  She says it is feeling better overall, unless she is up on it a lot throughout the day.  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  The pain now is mostly on the dorsal foot.                  Review of Systems all systems reviewed negative as they relate to the chief complaint.  No fevers or chills or other joint complaints   Assessment & Plan: Visit Diagnoses: No diagnosis found.  Plan: Impression is improving left foot metatarsalgia with Medrol Dosepak and fracture boot plan transition to regular shoes over the next month.  I think our decision point today was for or against further imaging or blood work.  Since she is improving I think we can hold off on those interventions for now.  However if she's not better by the beginning of December in terms of more or less where she wants to be in terms of being able to walk around and do activity then further blood work and MRI scanning would be indicated  Follow-Up Instructions: No Follow-up on file.   Orders:  No orders of the defined types were placed in this encounter.  No orders of the defined types were placed in this encounter.     Procedures: No procedures performed   Clinical Data: No  additional findings.  Objective: Vital Signs: There were no vitals taken for this visit.  Physical Exam  Constitutional: She appears well-developed.  HENT:  Head: Normocephalic.  Eyes: EOM are normal.  Neck: Normal range of motion.  Cardiovascular: Normal rate.   Pulmonary/Chest: Effort normal.  Neurological: She is alert.  Skin: Skin is warm.  Psychiatric: She has a normal mood and affect.    Ortho Exam examination the left foot demonstrates good ankle dorsi flexion plantar flexion inversion eversion strength with palpable intact nontender anterior to posterior tib peroneal and Achilles tendons.  Pedal pulses palpable.  Good ankle stability tibiotalar subtalar transverse tarsal range of motion.  Negative anterior drawer.  Negative varus tilt testing.  Does have tenderness to palpation along metatarsal heads to 3 and 4 but swelling is diminished and the pain is not quite as severe.  There is no lesser toe deformity.  No real paresthesias in the toes.  She can fully weight-bear on the left foot  Specialty Comments:  No specialty comments available.  Imaging: No results found.   PMFS History: Patient Active Problem List   Diagnosis Date Noted  . Late prenatal care 01/05/2013   Past Medical History:  Diagnosis Date  . Depression   .  No pertinent past medical history     Family History  Problem Relation Age of Onset  . Diabetes Father   . Heart disease Father   . Other Neg Hx     Past Surgical History:  Procedure Laterality Date  . NO PAST SURGERIES     Social History   Occupational History  . Not on file.   Social History Main Topics  . Smoking status: Current Every Day Smoker    Packs/day: 0.25    Types: Cigarettes  . Smokeless tobacco: Never Used  . Alcohol use 0.6 oz/week    1 Glasses of wine per week  . Drug use: No  . Sexual activity: Not Currently    Birth control/ protection: None

## 2017-11-28 ENCOUNTER — Emergency Department (HOSPITAL_COMMUNITY): Payer: Medicaid Other

## 2017-11-28 ENCOUNTER — Encounter (HOSPITAL_COMMUNITY): Payer: Self-pay

## 2017-11-28 ENCOUNTER — Emergency Department (HOSPITAL_COMMUNITY)
Admission: EM | Admit: 2017-11-28 | Discharge: 2017-11-28 | Disposition: A | Discharge status: Home / Self Care | Payer: Medicaid Other | Attending: Emergency Medicine | Admitting: Emergency Medicine

## 2017-11-28 DIAGNOSIS — Y939 Activity, unspecified: Secondary | ICD-10-CM | POA: Diagnosis not present

## 2017-11-28 DIAGNOSIS — Y990 Civilian activity done for income or pay: Secondary | ICD-10-CM | POA: Insufficient documentation

## 2017-11-28 DIAGNOSIS — S63502A Unspecified sprain of left wrist, initial encounter: Secondary | ICD-10-CM | POA: Insufficient documentation

## 2017-11-28 DIAGNOSIS — F17210 Nicotine dependence, cigarettes, uncomplicated: Secondary | ICD-10-CM | POA: Insufficient documentation

## 2017-11-28 DIAGNOSIS — W109XXA Fall (on) (from) unspecified stairs and steps, initial encounter: Secondary | ICD-10-CM | POA: Diagnosis not present

## 2017-11-28 DIAGNOSIS — Y929 Unspecified place or not applicable: Secondary | ICD-10-CM | POA: Insufficient documentation

## 2017-11-28 DIAGNOSIS — Z79899 Other long term (current) drug therapy: Secondary | ICD-10-CM | POA: Insufficient documentation

## 2017-11-28 MED ORDER — NAPROXEN 500 MG PO TABS: 500.0000 mg | ORAL_TABLET | Freq: Two times a day (BID) | ORAL | 0 refills | Status: DC

## 2017-11-28 MED ORDER — KETOROLAC TROMETHAMINE 60 MG/2ML IM SOLN
30.0000 mg | Freq: Once | INTRAMUSCULAR | Status: AC
  Administered 2017-11-28: 30 mg via INTRAMUSCULAR
  Filled 2017-11-28: qty 2

## 2017-11-28 NOTE — ED Provider Notes (Signed)
[facility] Provider Note   CSN: [id number] Arrival date & time: 11/28/17  [id number]     History   Chief Complaint Chief Complaint  Patient presents with  . Wrist Pain    HPI Carrie Rogers is a 26 y.o. female who presents to the ED with wrist pain. Patient reports that she fell up steps yesterday and caught her fall with her left hand causing her left wrist to hurt. Today at work she was going down the steps and slipped and grabbed the hand rail causing the pain in the left wrist to worsen. Patient denies other injuries.   HPI  Past Medical History:  Diagnosis Date  . Depression   . No pertinent past medical history     Patient Active Problem List   Diagnosis Date Noted  . Late prenatal care 01/05/2013    Past Surgical History:  Procedure Laterality Date  . NO PAST SURGERIES      OB History    Gravida Para Term Preterm AB Living   3 3 3  0 0 3   SAB TAB Ectopic Multiple Live Births   0 0 0 0 1       Home Medications    Prior to Admission medications   Medication Sig Start Date End Date Taking? Authorizing Provider  naproxen (NAPROSYN) 500 MG tablet Take 1 tablet (500 mg total) by mouth 2 (two) times daily. 11/28/17   Janne NapoleonNeese, Laelynn Blizzard M, NP  Prenatal Vit-Fe Fumarate-FA (MULTIVITAMIN-PRENATAL) 27-0.8 MG TABS Take 1 tablet by mouth daily.    [provider]  sertraline (ZOLOFT) 50 MG tablet Take 50 mg by mouth daily.    [provider]    Family History Family History  Problem Relation Age of Onset  . Diabetes Father   . Heart disease Father   . Other Neg Hx     Social History Social History   Tobacco Use  . Smoking status: Current Every Day Smoker    Packs/day: 0.25    Types: Cigarettes  . Smokeless tobacco: Never Used  Substance Use Topics  . Alcohol use: Yes    Alcohol/week: 0.6 oz    Types: 1 Glasses of wine per week  . Drug use: No     Allergies   Patient has no known allergies.   Review  of Systems Review of Systems  Musculoskeletal: Positive for arthralgias.       Left wrist pain  All other systems reviewed and are negative.    Physical Exam Updated Vital Signs BP 133/77 (BP Location: Right Arm)   Pulse 82   Temp 98.2 F (36.8 C) (Oral)   Resp 20   LMP 11/18/2017   SpO2 100%   Physical Exam  Constitutional: She appears well-developed and well-nourished. No distress.  HENT:  Head: Normocephalic and atraumatic.  Eyes: EOM are normal.  Neck: Normal range of motion. Neck supple.  Cardiovascular: Normal rate.  Pulmonary/Chest: Effort normal.  Musculoskeletal:       Left wrist: She exhibits tenderness. She exhibits no deformity and no laceration. Decreased range of motion: due to pain. Swelling: minimal.  Radial pulses 2+, adequate circulation. Tender with range of motion and palpation of the left wrist.   Neurological: She is alert.  Skin: Skin is warm and dry.  Psychiatric: She has a normal mood and affect. Her behavior is normal.  Nursing note and vitals reviewed.    ED Treatments / Results  Labs (all labs ordered are listed,  but only abnormal results are displayed) Labs Reviewed - No data to display   Radiology Dg Wrist Complete Left  Result Date: 11/28/2017 CLINICAL DATA:  26 year old female with fall and left wrist pain. EXAM: LEFT WRIST - COMPLETE 3+ VIEW COMPARISON:  Left wrist radiograph dated 08/14/2009 FINDINGS: There is no evidence of fracture or dislocation. There is no evidence of arthropathy or other focal bone abnormality. Soft tissues are unremarkable. IMPRESSION: Negative. Electronically Signed   By: Elgie CollardArash  Radparvar M.D.   On: 11/28/2017 19:50    Procedures Procedures (including critical care time)  Medications Ordered in ED Medications  ketorolac (TORADOL) injection 30 mg (not administered)     Initial Impression / Assessment and Plan / ED Course  I have reviewed the triage vital signs and the nursing notes. 26 y.o. female  with left wrist pain s/p fall yesterday and additional injury today stable for d/c without fracture or dislocation noted on x-ray. No focal neuro deficits. Velcro wrist splint applied for comfort and support by ortho tech. Pain managed in the ED. Return precautions discussed.    Final Clinical Impressions(s) / ED Diagnoses   Final diagnoses:  Sprain of left wrist, initial encounter    ED Discharge Orders        Ordered    naproxen (NAPROSYN) 500 MG tablet  2 times daily     11/28/17 2011       Kerrie Buffaloeese, Derionna Salvador Ke[place]M, [place] 11/28/17 2019    Charlynne PanderYao, David Hsienta, MD 11/29/17 1501

## 2017-11-28 NOTE — Discharge Instructions (Signed)
Follow up with your regular doctor, return here for worsening symptoms.

## 2017-11-28 NOTE — ED Triage Notes (Signed)
Pt fell up the stairs this evening and tried to catch herself with her left wrist, pt states it's painful to move

## 2017-12-15 ENCOUNTER — Ambulatory Visit (INDEPENDENT_AMBULATORY_CARE_PROVIDER_SITE_OTHER): Payer: Medicaid Other | Admitting: Orthopaedic Surgery

## 2017-12-15 ENCOUNTER — Encounter (INDEPENDENT_AMBULATORY_CARE_PROVIDER_SITE_OTHER): Payer: Self-pay | Admitting: Orthopaedic Surgery

## 2017-12-15 ENCOUNTER — Ambulatory Visit (INDEPENDENT_AMBULATORY_CARE_PROVIDER_SITE_OTHER): Payer: Medicaid Other

## 2017-12-15 DIAGNOSIS — M25532 Pain in left wrist: Secondary | ICD-10-CM | POA: Insufficient documentation

## 2017-12-15 NOTE — Progress Notes (Signed)
Office Visit Note   Patient: Carrie Rogers           Date of Birth: December 07, 1991           MRN: [id number] Visit Date: 12/15/2017              Requested by: No referring provider defined for this encounter. PCP: Patient, No Pcp Per   Assessment & Plan: Visit Diagnoses:  1. Pain in left wrist     Plan: Impression is 27 year old female with a nondisplaced ulnar styloid fracture.  Recommend removable wrist brace and activity and weightbearing as tolerated within the removable wrist brace.  Over-the-counter pain medicines as needed.  Follow-up in 4 weeks for recheck.  No x-rays needed unless she is still having symptoms.  Follow-Up Instructions: Return in about 4 weeks (around 01/12/2018).   Orders:  Orders Placed This Encounter  Procedures  . XR Wrist Complete Left   No orders of the defined types were placed in this encounter.     Procedures: No procedures performed   Clinical Data: No additional findings.   Subjective: Chief Complaint  Patient presents with  . Left Wrist - Pain    Patient is 27 year old female who had a fall onto her left wrist on 11/27/2017.  She is sensitive and tender over the ulnar styloid and distal ulna shaft denies any bruising.  Denies any numbness and tingling.  She has been wearing a removable wrist brace.    Review of Systems  Constitutional: Negative.   HENT: Negative.   Eyes: Negative.   Respiratory: Negative.   Cardiovascular: Negative.   Endocrine: Negative.   Musculoskeletal: Negative.   Neurological: Negative.   Hematological: Negative.   Psychiatric/Behavioral: Negative.   All other systems reviewed and are negative.    Objective: Vital Signs: LMP 11/18/2017   Physical Exam  Constitutional: She is oriented to person, place, and time. She appears well-developed and well-nourished.  Pulmonary/Chest: Effort normal.  Neurological: She is alert and oriented to person, place, and time.  Skin: Skin is warm. Capillary  refill takes less than 2 seconds.  Psychiatric: She has a normal mood and affect. Her behavior is normal. Judgment and thought content normal.  Nursing note and vitals reviewed.   Ortho Exam Left wrist exam shows no swelling and bruising.Marland Kitchen  ECU tendon is stable.  Motor and sensory function are intact.  Scaphoid is nontender.  She does have significant tenderness over the distal ulna Specialty Comments:  No specialty comments available.  Imaging: Xr Wrist Complete Left  Result Date: 12/15/2017 Lucency across ulnar styloid concerning for nondisplaced fracture    PMFS History: Patient Active Problem List   Diagnosis Date Noted  . Pain in left wrist 12/15/2017  . Late prenatal care 01/05/2013   Past Medical History:  Diagnosis Date  . Depression   . No pertinent past medical history     Family History  Problem Relation Age of Onset  . Diabetes Father   . Heart disease Father   . Other Neg Hx     Past Surgical History:  Procedure Laterality Date  . NO PAST SURGERIES     Social History   Occupational History  . Not on file  Tobacco Use  . Smoking status: Current Every Day Smoker    Packs/day: 0.25    Types: Cigarettes  . Smokeless tobacco: Never Used  Substance and Sexual Activity  . Alcohol use: Yes    Alcohol/week: 0.6 oz    Types:  1 Glasses of wine per week  . Drug use: No  . Sexual activity: Not Currently    Birth control/protection: None

## 2017-12-17 ENCOUNTER — Ambulatory Visit (INDEPENDENT_AMBULATORY_CARE_PROVIDER_SITE_OTHER): Payer: Self-pay | Admitting: Orthopaedic Surgery

## 2018-01-12 ENCOUNTER — Ambulatory Visit (INDEPENDENT_AMBULATORY_CARE_PROVIDER_SITE_OTHER): Payer: Medicaid Other | Admitting: Orthopaedic Surgery

## 2018-01-12 ENCOUNTER — Encounter (INDEPENDENT_AMBULATORY_CARE_PROVIDER_SITE_OTHER): Payer: Self-pay | Admitting: Orthopaedic Surgery

## 2018-01-12 DIAGNOSIS — S52616D Nondisplaced fracture of unspecified ulna styloid process, subsequent encounter for closed fracture with routine healing: Secondary | ICD-10-CM | POA: Insufficient documentation

## 2018-01-12 DIAGNOSIS — S52615D Nondisplaced fracture of left ulna styloid process, subsequent encounter for closed fracture with routine healing: Secondary | ICD-10-CM

## 2018-01-12 NOTE — Progress Notes (Signed)
   Office Visit Note   Patient: Carrie Rogers           Date of Birth: [birth year]           MRN: [id number] Visit Date: 01/12/2018              Requested by: No referring provider defined for this encounter. PCP: Patient, No Pcp Per   Assessment & Plan: Visit Diagnoses:  1. Closed nondisplaced fracture of styloid process of left ulna with routine healing, subsequent encounter     Plan: At this point I believe Kaitlyn's fracture is clinically healed.  She will come out of her wrist splint at this point and continue working on range of motion exercises.  Advance with activity as tolerated.  Follow-up with us on an as-needed basis.  Follow-Up Instructions: No Follow-up on file.   Orders:  No orders of the defined types were placed in this encounter.  No orders of the defined types were placed in this encounter.     Procedures: No procedures performed   Clinical Data: No additional findings.   Subjective: Chief Complaint  Patient presents with  . Left Wrist - Pain    HPI [name] is a pleasant 27 year old female who presents to our clinic today for follow-up of her left wrist.  Status post nondisplaced ulnar styloid fracture date of injury 12/04/2017.  She has been wearing a removable wrist splint.  She has been working on her home exercise therapy as well.  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  Nothing more.  Overall doing much better.  Review of Systems as detailed in HPI.  All others reviewed and are negative.   Objective: Vital Signs: There were no vitals taken for this visit.  Physical Exam well-developed well-nourished female no acute distress.  Alert and oriented x3.  Ortho Exam examination of her left wrist reveals full range of motion in all planes.  Minimal tenderness over the ulnar styloid.  She is neurovascular intact distally.  Specialty Comments:  No  specialty comments available.  Imaging: No new imaging today.   PMFS History: Patient Active Problem List   Diagnosis Date Noted  . Closed nondisplaced fracture of styloid process of ulna with routine healing 01/12/2018  . Pain in left wrist 12/15/2017  . Late prenatal care 01/05/2013   Past Medical History:  Diagnosis Date  . Depression   . No pertinent past medical history     Family History  Problem Relation Age of Onset  . Diabetes Father   . Heart disease Father   . Other Neg Hx     Past Surgical History:  Procedure Laterality Date  . NO PAST SURGERIES     Social History   Occupational History  . Not on file  Tobacco Use  . Smoking status: Current Every Day Smoker    Packs/day: 0.25    Types: Cigarettes  . Smokeless tobacco: Never Used  Substance and Sexual Activity  . Alcohol use: Yes    Alcohol/week: 0.6 oz    Types: 1 Glasses of wine per week  . Drug use: No  . Sexual activity: Not Currently    Birth control/protection: None

## 2018-03-16 ENCOUNTER — Emergency Department (HOSPITAL_COMMUNITY)
Admission: EM | Admit: 2018-03-16 | Discharge: 2018-03-16 | Discharge status: Against Medical Advice | Payer: Medicaid Other | Attending: Emergency Medicine | Admitting: Emergency Medicine

## 2018-03-16 ENCOUNTER — Encounter (HOSPITAL_COMMUNITY): Payer: Self-pay | Admitting: *Deleted

## 2018-03-16 DIAGNOSIS — Z5321 Procedure and treatment not carried out due to patient leaving prior to being seen by health care provider: Secondary | ICD-10-CM | POA: Insufficient documentation

## 2018-03-16 DIAGNOSIS — M549 Dorsalgia, unspecified: Secondary | ICD-10-CM | POA: Diagnosis present

## 2018-03-16 NOTE — ED Triage Notes (Signed)
Pt states she fell down 4-5 stairs this morning and has lower back pain.

## 2021-01-30 ENCOUNTER — Emergency Department (HOSPITAL_COMMUNITY)
Admission: EM | Admit: 2021-01-30 | Discharge: 2021-01-30 | Disposition: A | Discharge status: Home / Self Care | Payer: BC Managed Care – PPO | Attending: Emergency Medicine | Admitting: Emergency Medicine

## 2021-01-30 ENCOUNTER — Other Ambulatory Visit: Payer: Self-pay

## 2021-01-30 ENCOUNTER — Encounter (HOSPITAL_COMMUNITY): Payer: Self-pay | Admitting: Emergency Medicine

## 2021-01-30 ENCOUNTER — Emergency Department (HOSPITAL_COMMUNITY): Payer: BC Managed Care – PPO

## 2021-01-30 DIAGNOSIS — R102 Pelvic and perineal pain: Secondary | ICD-10-CM | POA: Insufficient documentation

## 2021-01-30 DIAGNOSIS — R110 Nausea: Secondary | ICD-10-CM | POA: Insufficient documentation

## 2021-01-30 DIAGNOSIS — F17210 Nicotine dependence, cigarettes, uncomplicated: Secondary | ICD-10-CM | POA: Diagnosis not present

## 2021-01-30 LAB — CBC
HCT: 39.2 % (ref 36.0–46.0)
Hemoglobin: 13.1 g/dL (ref 12.0–15.0)
MCH: 31.6 pg (ref 26.0–34.0)
MCHC: 33.4 g/dL (ref 30.0–36.0)
MCV: 94.7 fL (ref 80.0–100.0)
Platelets: 240 10*3/uL (ref 150–400)
RBC: 4.14 MIL/uL (ref 3.87–5.11)
RDW: 11.4 % — ABNORMAL LOW (ref 11.5–15.5)
WBC: 5.9 10*3/uL (ref 4.0–10.5)
nRBC: 0 % (ref 0.0–0.2)

## 2021-01-30 LAB — URINALYSIS, ROUTINE W REFLEX MICROSCOPIC
Bilirubin Urine: NEGATIVE
Glucose, UA: NEGATIVE mg/dL
Hgb urine dipstick: NEGATIVE
Ketones, ur: 5 mg/dL — AB
Leukocytes,Ua: NEGATIVE
Nitrite: NEGATIVE
Protein, ur: NEGATIVE mg/dL
Specific Gravity, Urine: 1.008 (ref 1.005–1.030)
pH: 6 (ref 5.0–8.0)

## 2021-01-30 LAB — WET PREP, GENITAL
Clue Cells Wet Prep HPF POC: NONE SEEN
Sperm: NONE SEEN
Trich, Wet Prep: NONE SEEN
Yeast Wet Prep HPF POC: NONE SEEN

## 2021-01-30 LAB — COMPREHENSIVE METABOLIC PANEL
ALT: 15 U/L (ref 0–44)
AST: 18 U/L (ref 15–41)
Albumin: 4.1 g/dL (ref 3.5–5.0)
Alkaline Phosphatase: 57 U/L (ref 38–126)
Anion gap: 11 (ref 5–15)
BUN: 8 mg/dL (ref 6–20)
CO2: 26 mmol/L (ref 22–32)
Calcium: 9.5 mg/dL (ref 8.9–10.3)
Chloride: 102 mmol/L (ref 98–111)
Creatinine, Ser: 0.62 mg/dL (ref 0.44–1.00)
GFR, Estimated: >60 mL/min (ref >60)
Glucose, Bld: 79 mg/dL (ref 70–99)
Potassium: 3.9 mmol/L (ref 3.5–5.1)
Sodium: 139 mmol/L (ref 135–145)
Total Bilirubin: 0.6 mg/dL (ref 0.3–1.2)
Total Protein: 6.6 g/dL (ref 6.5–8.1)

## 2021-01-30 LAB — I-STAT BETA HCG BLOOD, ED (MC, WL, AP ONLY): I-stat hCG, quantitative: <5 m[IU]/mL (ref <5)

## 2021-01-30 MED ORDER — MELOXICAM 7.5 MG PO TABS: 7.5000 mg | ORAL_TABLET | Freq: Every day | ORAL | 0 refills | Status: AC

## 2021-01-30 MED ORDER — IBUPROFEN 600 MG PO TABS: 600.0000 mg | ORAL_TABLET | Freq: Four times a day (QID) | ORAL | 0 refills | Status: AC | PRN

## 2021-01-30 NOTE — ED Notes (Signed)
Patient transported to Ultrasound 

## 2021-01-30 NOTE — ED Provider Notes (Signed)
Care assumed from Army Melia, PA-C. See her note for full H&P.   Per her note, "30 year old female presents with complaint of left pelvic pain onset 1 week ago, constant, worse with movement, heavy lifting at work, associated with nausea with increased pain. Denies vomiting, fevers, chills, changes in bowel or bladder habits, abnormal vaginal discharge, new partners or concerns for STDs, no history of ovarian cysts. LMP 1 week ago. No other complaints or concerns. "   Physical Exam  BP 114/77    Pulse 73    Temp 98.5 F (36.9 C) (Oral)    Resp 19    LMP  (Within Days)    SpO2 100%   Physical Exam  ED Course/Procedures   Clinical Course as of 01/30/21 2030  Wed Jan 30, 2021  7074 30 year old female with complaint of upper quadrant abdominal pain x1 week, constant nature, occasionally associated with nausea. Denies any associated symptoms. On exam of abdominal tenderness left lower quadrant. Plan is for ultrasound to assess for torsion versus cyst. Review of records, patient was seen in a [facility] ER 4 days ago for same complaint, found to have a UTI and treated with Keflex. Patient had an ultrasound at that time which did not show the left ovary, CT abdomen pelvis without acute findings. [LM]  1907 Pelvic exam with slight yellow discharge, no CMT, no adnexal tenderness. Plan is to follow up with GYN, given follow up list. Feels like Naproxen isn't helping pain and is causing problems at work, will give rx for meloxicam, advised to stop NSAIDs/naproxen. Care signed out pending wet prep and [place] report.  [LM]    Clinical Course User Index [LM] Jeannie Fend, PA-C    Procedures  Results for orders placed or performed during the hospital encounter of 01/30/21  Wet prep, genital  Result Value Ref Range   Yeast Wet Prep HPF POC NONE SEEN NONE SEEN   Trich, Wet Prep NONE SEEN NONE SEEN   Clue Cells Wet Prep HPF POC NONE SEEN NONE SEEN   WBC, Wet Prep HPF POC MODERATE (A) NONE SEEN   Sperm  NONE SEEN   Comprehensive metabolic panel  Result Value Ref Range   Sodium 139 135 - 145 mmol/L   Potassium 3.9 3.5 - 5.1 mmol/L   Chloride 102 98 - 111 mmol/L   CO2 26 22 - 32 mmol/L   Glucose, Bld 79 70 - 99 mg/dL   BUN 8 6 - 20 mg/dL   Creatinine, Ser 8.54 0.44 - 1.00 mg/dL   Calcium 9.5 8.9 - 62.7 mg/dL   Total Protein 6.6 6.5 - 8.1 g/dL   Albumin 4.1 3.5 - 5.0 g/dL   AST 18 15 - 41 U/L   ALT 15 0 - 44 U/L   Alkaline Phosphatase 57 38 - 126 U/L   Total Bilirubin 0.6 0.3 - 1.2 mg/dL   GFR, Estimated >03 >50 mL/min   Anion gap 11 5 - 15  CBC  Result Value Ref Range   WBC 5.9 4.0 - 10.5 K/uL   RBC 4.14 3.87 - 5.11 MIL/uL   Hemoglobin 13.1 12.0 - 15.0 g/dL   HCT 09.3 81.8 - 29.9 %   MCV 94.7 80.0 - 100.0 fL   MCH 31.6 26.0 - 34.0 pg   MCHC 33.4 30.0 - 36.0 g/dL   RDW 37.1 (L) 69.6 - 78.9 %   Platelets 240 150 - 400 K/uL   nRBC 0.0 0.0 - 0.2 %  Urinalysis,  Routine w reflex microscopic Urine, Clean Catch  Result Value Ref Range   Color, Urine STRAW (A) YELLOW   APPearance HAZY (A) CLEAR   Specific Gravity, Urine 1.008 1.005 - 1.030   pH 6.0 5.0 - 8.0   Glucose, UA NEGATIVE NEGATIVE mg/dL   Hgb urine dipstick NEGATIVE NEGATIVE   Bilirubin Urine NEGATIVE NEGATIVE   Ketones, ur 5 (A) NEGATIVE mg/dL   Protein, ur NEGATIVE NEGATIVE mg/dL   Nitrite NEGATIVE NEGATIVE   Leukocytes,Ua NEGATIVE NEGATIVE  I-Stat beta hCG blood, ED  Result Value Ref Range   I-stat hCG, quantitative <5.0 <5 mIU/mL   Comment 3           No results found.   MDM   30 year old female presenting for evaluation of left lower pelvic pain.  Was seen at an outside ED earlier this week and had an ultrasound and a CT scan that were reassuring.  Her left ovary was not visualized at that time.  She did have a urinalysis that was suggestive of a possible UTI so she was started on Keflex.  Her symptoms have persisted and her urine culture came back negative so she presents to the ED today for further  evaluation.  Her laboratory work, UA and wet prep are all reassuring.  She is low risk for STD therefore prophylactic treatment held at this time.  Prior provider completed pelvic exam which was not suggestive of PID.  Pelvic ultrasound today shows a left ovarian cyst which is likely the cause of the patient's pain.  I have prescribed Rx for anti-inflammatories and recommended patient follow-up with OB/GYN.  Advised on return precautions.  She voices understanding of the plan and reasons to return. all Questions answered.  Patient stable for discharge       Rayne Du 01/30/21 2030    Eber Hong, MD 01/31/21 1455

## 2021-01-30 NOTE — ED Notes (Signed)
Patient verbalized understanding of discharge instructions. Opportunity for questions and answers.  

## 2021-01-30 NOTE — ED Triage Notes (Signed)
Patient complains of lower left ovarian pain that started two days ago. Denies bleeding or discharge, reports nausea when pain is most severe. Patient alert, oriented, and in no apparent distress at this time.

## 2021-01-30 NOTE — ED Provider Notes (Signed)
[facility] EMERGENCY DEPARTMENT Provider Note   CSN: [id number] Arrival date & time: 01/30/21  1422      History Chief Complaint  Patient presents with   Pelvic Pain    Carrie Rogers is a 30 y.o. female.  30 year old female presents with complaint of left pelvic pain onset 1 week ago, constant, worse with movement, heavy lifting at work, associated with nausea with increased pain. Denies vomiting, fevers, chills, changes in bowel or bladder habits, abnormal vaginal discharge, new partners or concerns for STDs, no history of ovarian cysts. LMP 1 week ago. No other complaints or concerns.         Past Medical History:  Diagnosis Date   Depression    No pertinent past medical history     Patient Active Problem List   Diagnosis Date Noted   Closed nondisplaced fracture of styloid process of ulna with routine healing 01/12/2018   Pain in left wrist 12/15/2017   Late prenatal care 01/05/2013    Past Surgical History:  Procedure Laterality Date   NO PAST SURGERIES       OB History    Gravida  3   Para  3   Term  3   Preterm  0   AB  0   Living  3     SAB  0   IAB  0   Ectopic  0   Multiple  0   Live Births  1           Family History  Problem Relation Age of Onset   Diabetes Father    Heart disease Father    Other Neg Hx     Social History   Tobacco Use   Smoking status: Current Every Day Smoker    Packs/day: 0.25    Types: Cigarettes   Smokeless tobacco: Never Used  Substance Use Topics   Alcohol use: Yes    Alcohol/week: 1.0 standard drink    Types: 1 Glasses of wine per week   Drug use: No    Home Medications Prior to Admission medications   Medication Sig Start Date End Date Taking? Authorizing Provider  meloxicam (MOBIC) 7.5 MG tablet Take 1 tablet (7.5 mg total) by mouth daily for 10 days. 01/30/21 02/09/21 Yes Jeannie Fend, PA-C  Prenatal Vit-Fe Fumarate-FA (MULTIVITAMIN-PRENATAL) 27-0.8  MG TABS Take 1 tablet by mouth daily.    [provider]  sertraline (ZOLOFT) 50 MG tablet Take 50 mg by mouth daily.    [provider]    Allergies    Patient has no known allergies.  Review of Systems   Review of Systems  Constitutional: Negative for chills and fever.  Respiratory: Negative for shortness of breath.   Cardiovascular: Negative for chest pain.  Gastrointestinal: Positive for abdominal pain and nausea. Negative for blood in stool, constipation, diarrhea and vomiting.  Genitourinary: Positive for pelvic pain. Negative for dysuria, frequency, vaginal bleeding, vaginal discharge and vaginal pain.  Musculoskeletal: Negative for arthralgias, back pain and myalgias.  Skin: Negative for rash and wound.  Allergic/Immunologic: Negative for immunocompromised state.  Neurological: Negative for weakness.  Hematological: Negative for adenopathy.  Psychiatric/Behavioral: Negative for confusion.  All other systems reviewed and are negative.   Physical Exam Updated Vital Signs BP 111/75    Pulse 68    Temp 98.5 F (36.9 C) (Oral)    Resp 14    LMP  (Within Days)    SpO2 98%  Physical Exam Vitals and nursing note reviewed. Exam conducted with a chaperone present.  Constitutional:      General: She is not in acute distress.    Appearance: She is well-developed and well-nourished. She is not diaphoretic.  HENT:     Head: Normocephalic and atraumatic.  Cardiovascular:     Rate and Rhythm: Normal rate and regular rhythm.     Pulses: Normal pulses.     Heart sounds: Normal heart sounds.  Pulmonary:     Effort: Pulmonary effort is normal.     Breath sounds: Normal breath sounds.  Abdominal:     Palpations: Abdomen is soft.     Tenderness: There is abdominal tenderness in the left lower quadrant. There is no right CVA tenderness or left CVA tenderness.  Genitourinary:    Vagina: Vaginal discharge present.     Cervix: No cervical motion tenderness, friability  or erythema.     Adnexa: Right adnexa normal and left adnexa normal.     Comments: Mild yellow dc, IUD strings visible  Skin:    General: Skin is warm and dry.     Findings: No erythema or rash.  Neurological:     Mental Status: She is alert and oriented to person, place, and time.  Psychiatric:        Mood and Affect: Mood and affect normal.        Behavior: Behavior normal.     ED Results / Procedures / Treatments   Labs (all labs ordered are listed, but only abnormal results are displayed) Labs Reviewed  CBC - Abnormal; Notable for the following components:      Result Value   RDW 11.4 (*)    All other components within normal limits  WET PREP, GENITAL  COMPREHENSIVE METABOLIC PANEL  URINALYSIS, ROUTINE W REFLEX MICROSCOPIC  I-STAT BETA HCG BLOOD, ED ([facility], [facility], [facility] ONLY)  GC/CHLAMYDIA PROBE AMP () NOT AT [facility]    EKG None  Radiology No results found.  Procedures Procedures   Medications Ordered in ED Medications - No data to display  ED Course  I have reviewed the triage vital signs and the nursing notes.  Pertinent labs & imaging results that were available during my care of the patient were reviewed by me and considered in my medical decision making (see chart for details).  Clinical Course as of 01/30/21 1909  Wed Jan 30, 2021  4228 30 year old female with complaint of upper quadrant abdominal pain x1 week, constant nature, occasionally associated with nausea. Denies any associated symptoms. On exam of abdominal tenderness left lower quadrant. Plan is for ultrasound to assess for torsion versus cyst. Review of records, patient was seen in a [facility] ER 4 days ago for same complaint, found to have a UTI and treated with Keflex. Patient had an ultrasound at that time which did not show the left ovary, CT abdomen pelvis without acute findings. [LM]  1907 Pelvic exam with slight yellow discharge, no CMT, no adnexal tenderness. Plan is to follow up with GYN,  given follow up list. Feels like Naproxen isn't helping pain and is causing problems at work, will give rx for meloxicam, advised to stop NSAIDs/naproxen. Care signed out pending wet prep and [place] report.  [LM]    Clinical Course User Index [LM] Alden Hipp   MDM Rules/Calculators/A&P                          Final  Clinical Impression(s) / ED Diagnoses Final diagnoses:  None    Rx / DC Orders ED Discharge Orders         Ordered    meloxicam (MOBIC) 7.5 MG tablet  Daily        01/30/21 1907           Alden Hipp 01/30/21 Darrick Huntsman, MD 01/31/21 1455

## 2021-01-30 NOTE — Discharge Instructions (Addendum)
Your urinalysis did not show any signs of infection.   You have been tested for chlamydia and gonorrhea.  These results will be available in approximately 3 days and you will be contacted by the hospital if the results are positive. Avoid sexual contact until you are aware of the results, and please inform all sexual partners if you test positive for any of these diseases.  There are many options for quick evaluation and management of certain GYN issues that do not require a long wait time or big bill from the emergency department.   Consider these options for your care in the future:   Walk-ins for certain complaints available at:   [facility] [address]  [phone]  See hours at [email or web address]   Center for [employer] [facility] [employer] [facility]  [address]  [phone]   Center for [employer] at [employer]  [phone]   You can make an appointment to see a GYN provider:   Center for [facility] at [facility]  [address]  [phone]   Center for [facility] at [facility]  [address] [employer]  [phone]   Center for [facility] Healthcare at [facility]  [phone]   Center for [facility] at [facility]  [address], [address]  [phone]   If you already have an established OB/GYN provider in the area you can make an appointment with them as well.

## 2021-01-31 LAB — GC/CHLAMYDIA PROBE AMP (~~LOC~~) NOT AT ARMC
Chlamydia: NEGATIVE
Comment: NEGATIVE
Comment: NORMAL
Neisseria Gonorrhea: NEGATIVE

## 2021-06-02 ENCOUNTER — Encounter (HOSPITAL_COMMUNITY): Payer: Self-pay

## 2021-06-02 ENCOUNTER — Emergency Department (HOSPITAL_COMMUNITY)
Admission: EM | Admit: 2021-06-02 | Discharge: 2021-06-02 | Disposition: A | Discharge status: Home / Self Care | Payer: BC Managed Care – PPO | Attending: Emergency Medicine | Admitting: Emergency Medicine

## 2021-06-02 ENCOUNTER — Other Ambulatory Visit: Payer: Self-pay

## 2021-06-02 ENCOUNTER — Emergency Department (HOSPITAL_COMMUNITY): Payer: BC Managed Care – PPO

## 2021-06-02 DIAGNOSIS — M545 Low back pain, unspecified: Secondary | ICD-10-CM | POA: Diagnosis not present

## 2021-06-02 DIAGNOSIS — F17210 Nicotine dependence, cigarettes, uncomplicated: Secondary | ICD-10-CM | POA: Insufficient documentation

## 2021-06-02 DIAGNOSIS — M25512 Pain in left shoulder: Secondary | ICD-10-CM | POA: Insufficient documentation

## 2021-06-02 NOTE — Discharge Instructions (Addendum)
You have been seen today for a shoulder injury. There were no acute abnormalities on the x-rays, including no sign of fracture or dislocation, however, there could be injuries to the soft tissues, such as the ligaments or tendons that are not seen on xrays. There could also be what are called occult fractures that are small fractures not seen on xray. Antiinflammatory medications: Take 600 mg of ibuprofen every 6 hours or 440 mg (over the counter dose) to 500 mg (prescription dose) of naproxen every 12 hours for the next 3 days. After this time, these medications may be used as needed for pain. Take these medications with food to avoid upset stomach. Choose only one of these medications, do not take them together. Acetaminophen (generic for Tylenol): Should you continue to have additional pain while taking the ibuprofen or naproxen, you may add in acetaminophen as needed. Your daily total maximum amount of acetaminophen from all sources should be limited to 4000mg /day for persons without liver problems, or 2000mg /day for those with liver problems. Lidocaine patches: These are available via either prescription or over-the-counter. The over-the-counter option may be more economical one and are likely just as effective. There are multiple over-the-counter brands, such as Salonpas. Ice: May apply ice to the area over the next 24 hours for 15 minutes at a time to reduce swelling. Elevation: Keep the extremity elevated as often as possible to reduce pain and inflammation. Exercises: Start by performing these exercises a few times a week, increasing the frequency until you are performing them twice daily.  Lifting and other activities: Let pain be your guide with these activities.  If the action or weight causes increased pain, avoid this activity. Follow up: If symptoms are improving, you may follow up with your primary care provider for any continued management. If symptoms are not starting to improve within a  week, you should follow up with the orthopedic specialist within two weeks. Return: Return to the ED for numbness, weakness, increasing pain, overall worsening symptoms, loss of function, or if symptoms are not improving, you have tried to follow up with the orthopedic specialist, and have been unable to do so.  For prescription assistance, may try using prescription discount sites or apps, such as goodrx.com or Good Rx smart phone app.
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
## 2022-09-06 ENCOUNTER — Encounter: Payer: Self-pay | Admitting: Emergency Medicine

## 2022-09-06 ENCOUNTER — Ambulatory Visit
Admission: EM | Admit: 2022-09-06 | Discharge: 2022-09-06 | Disposition: A | Discharge status: Home / Self Care | Payer: Medicaid Other | Attending: Family Medicine | Admitting: Family Medicine

## 2022-09-06 DIAGNOSIS — A084 Viral intestinal infection, unspecified: Secondary | ICD-10-CM

## 2022-09-06 MED ORDER — ONDANSETRON HCL 8 MG PO TABS: 8.0000 mg | ORAL_TABLET | Freq: Three times a day (TID) | ORAL | 0 refills | Status: AC | PRN

## 2022-09-06 MED ORDER — ONDANSETRON 4 MG PO TBDP
4.0000 mg | ORAL_TABLET | Freq: Once | ORAL | Status: AC
  Administered 2022-09-06: 4 mg via ORAL

## 2022-09-06 NOTE — ED Provider Notes (Signed)
[facility]    CSN: [id number] Arrival date & time: 09/06/22  4098      History   Chief Complaint Chief Complaint  Patient presents with   Emesis    HPI Carrie Rogers is a 31 y.o. female.   HPI Patient's had nausea vomiting and diarrhea since yesterday.  She has 3 school-aged children but does not know that any of them are sick.  She states that they usually give her virus at least twice a year.  She also works in [employer] and is around [employer] quite a lot.  No fever or chills.  No headache or body ache.  No sore throat or cough.  Past Medical History:  Diagnosis Date   Depression    No pertinent past medical history     Patient Active Problem List   Diagnosis Date Noted   Closed nondisplaced fracture of styloid process of ulna with routine healing 01/12/2018   Pain in left wrist 12/15/2017   Late prenatal care 01/05/2013    Past Surgical History:  Procedure Laterality Date   NO PAST SURGERIES      OB History     Gravida  3   Para  3   Term  3   Preterm  0   AB  0   Living  3      SAB  0   IAB  0   Ectopic  0   Multiple  0   Live Births  1            Home Medications    Prior to Admission medications   Medication Sig Start Date End Date Taking? Authorizing Provider  ondansetron (ZOFRAN) 8 MG tablet Take 1 tablet (8 mg total) by mouth every 8 (eight) hours as needed for nausea or vomiting. 09/06/22  Yes Raylene Everts, MD  ibuprofen (ADVIL) 600 MG tablet Take 1 tablet (600 mg total) by mouth every 6 (six) hours as needed. 01/30/21   Couture, Cortni S, PA-C  Prenatal Vit-Fe Fumarate-FA (MULTIVITAMIN-PRENATAL) 27-0.8 MG TABS Take 1 tablet by mouth daily.    [provider]  sertraline (ZOLOFT) 50 MG tablet Take 50 mg by mouth daily.    [provider]    Family History Family History  Problem Relation Age of Onset   Diabetes Father    Heart disease Father    Other Neg Hx     Social  History Social History   Tobacco Use   Smoking status: Former    Packs/day: 0.25    Types: Cigarettes   Smokeless tobacco: Never  Vaping Use   Vaping Use: Every day   Substances: Nicotine, Flavoring  Substance Use Topics   Alcohol use: Yes    Alcohol/week: 1.0 standard drink of alcohol    Types: 1 Glasses of wine per week   Drug use: No     Allergies   Patient has no known allergies.   Review of Systems Review of Systems See HPI  Physical Exam Triage Vital Signs ED Triage Vitals  Enc Vitals Group     BP 09/06/22 1118 123/87     Pulse Rate 09/06/22 1118 75     Resp 09/06/22 1118 16     Temp 09/06/22 1118 98.4 F (36.9 C)     Temp Source 09/06/22 1118 Oral     SpO2 09/06/22 1118 100 %     Weight --      Height --  Head Circumference --      Peak Flow --      Pain Score 09/06/22 1120 0     Pain Loc --      Pain Edu? --      Excl. in [place]? --    No data found.  Updated Vital Signs BP 123/87 (BP Location: Right Arm)   Pulse 75   Temp 98.4 F (36.9 C) (Oral)   Resp 16   SpO2 100%      Physical Exam Constitutional:      General: She is not in acute distress.    Appearance: She is well-developed.  HENT:     Head: Normocephalic and atraumatic.     Mouth/Throat:     Mouth: Mucous membranes are moist.  Eyes:     Conjunctiva/sclera: Conjunctivae normal.     Pupils: Pupils are equal, round, and reactive to light.  Cardiovascular:     Rate and Rhythm: Normal rate.  Pulmonary:     Effort: Pulmonary effort is normal. No respiratory distress.  Abdominal:     General: There is no distension.     Palpations: Abdomen is soft.     Comments: Active bowel sounds.  No tenderness  Musculoskeletal:        General: Normal range of motion.     Cervical back: Normal range of motion.  Skin:    General: Skin is warm and dry.  Neurological:     Mental Status: She is alert.      UC Treatments / Results  Labs (all labs ordered are listed, but only abnormal  results are displayed) Labs Reviewed - No data to display  EKG   Radiology No results found.  Procedures Procedures (including critical care time)  Medications Ordered in UC Medications  ondansetron (ZOFRAN-ODT) disintegrating tablet 4 mg (4 mg Oral Given 09/06/22 1122)    Initial Impression / Assessment and Plan / UC Course  I have reviewed the triage vital signs and the nursing notes.  Pertinent labs & imaging results that were available during my care of the patient were reviewed by me and considered in my medical decision making (see chart for details).     Final Clinical Impressions(s) / UC Diagnoses   Final diagnoses:  Viral gastroenteritis     Discharge Instructions      Continue to drink plenty of electrolyte replacement fluids (Gatorade or Pedialyte) May take Imodium to reduce diarrhea Take Zofran for nausea and vomiting Call for problems   ED Prescriptions     Medication Sig Dispense Auth. Provider   ondansetron (ZOFRAN) 8 MG tablet Take 1 tablet (8 mg total) by mouth every 8 (eight) hours as needed for nausea or vomiting. 20 tablet Raylene Everts, MD      PDMP not reviewed this encounter.   Raylene Everts, MD 09/06/22 [phone]

## 2022-09-06 NOTE — Discharge Instructions (Signed)
Continue to drink plenty of electrolyte replacement fluids (Gatorade or Pedialyte) May take Imodium to reduce diarrhea Take Zofran for nausea and vomiting Call for problems

## 2022-09-06 NOTE — ED Triage Notes (Signed)
Pt states yesterday she started having diarrhea and vomiting, she has tried imodium with little relief. She is feeling nausea right now. Denies abdominal pain.

## 2022-09-07 ENCOUNTER — Telehealth: Payer: Self-pay | Admitting: Emergency Medicine

## 2022-09-07 NOTE — Telephone Encounter (Signed)
Spoke with patient states that the nausea meds given has really helped patient.  Patient will continue to rest and push fluids.  Will follow up as needed.

## 2023-03-01 ENCOUNTER — Other Ambulatory Visit: Payer: Self-pay

## 2023-03-01 ENCOUNTER — Ambulatory Visit
Admission: EM | Admit: 2023-03-01 | Discharge: 2023-03-01 | Disposition: A | Discharge status: Home / Self Care | Payer: BC Managed Care – PPO | Attending: Family Medicine | Admitting: Family Medicine

## 2023-03-01 DIAGNOSIS — H6691 Otitis media, unspecified, right ear: Secondary | ICD-10-CM

## 2023-03-01 DIAGNOSIS — H9201 Otalgia, right ear: Secondary | ICD-10-CM

## 2023-03-01 MED ORDER — PREDNISONE 20 MG PO TABS: ORAL_TABLET | ORAL | 0 refills | Status: AC

## 2023-03-01 MED ORDER — AMOXICILLIN-POT CLAVULANATE 875-125 MG PO TABS: 1.0000 | ORAL_TABLET | Freq: Two times a day (BID) | ORAL | 0 refills | Status: AC

## 2023-03-01 NOTE — ED Provider Notes (Signed)
[facility]    CSN: [id number] Arrival date & time: 03/01/23  0944      History   Chief Complaint Chief Complaint  Patient presents with   Ear Pain    X1 day  Right ear pain    HPI Carrie Rogers is a 32 y.o. female.   HPI 33 year old female presents with right ear pain for 1 day.  PMH significant for obesity and depression.  Past Medical History:  Diagnosis Date   Depression    No pertinent past medical history     Patient Active Problem List   Diagnosis Date Noted   Closed nondisplaced fracture of styloid process of ulna with routine healing 01/12/2018   Pain in left wrist 12/15/2017   Late prenatal care 01/05/2013    Past Surgical History:  Procedure Laterality Date   NO PAST SURGERIES      OB History     Gravida  3   Para  3   Term  3   Preterm  0   AB  0   Living  3      SAB  0   IAB  0   Ectopic  0   Multiple  0   Live Births  1            Home Medications    Prior to Admission medications   Medication Sig Start Date End Date Taking? Authorizing Provider  amoxicillin-clavulanate (AUGMENTIN) 875-125 MG tablet Take 1 tablet by mouth 2 (two) times daily for 10 days. 03/01/23 03/11/23 Yes Eliezer Lofts, FNP  ibuprofen (ADVIL) 600 MG tablet Take 1 tablet (600 mg total) by mouth every 6 (six) hours as needed. 01/30/21  Yes Couture, Cortni S, PA-C  predniSONE (DELTASONE) 20 MG tablet Take 3 tabs PO daily x 5 days. 03/01/23  Yes Eliezer Lofts, FNP  Prenatal Vit-Fe Fumarate-FA (MULTIVITAMIN-PRENATAL) 27-0.8 MG TABS Take 1 tablet by mouth daily.   Yes [provider]  ondansetron (ZOFRAN) 8 MG tablet Take 1 tablet (8 mg total) by mouth every 8 (eight) hours as needed for nausea or vomiting. 09/06/22   Raylene Everts, MD  sertraline (ZOLOFT) 50 MG tablet Take 50 mg by mouth daily.    [provider]    Family History Family History  Problem Relation Age of Onset   Diabetes Father    Heart disease  Father    Other Neg Hx     Social History Social History   Tobacco Use   Smoking status: Former    Packs/day: .25    Types: Cigarettes   Smokeless tobacco: Never  Vaping Use   Vaping Use: Every day   Substances: Nicotine, Flavoring  Substance Use Topics   Alcohol use: Yes    Alcohol/week: 1.0 standard drink of alcohol    Types: 1 Glasses of wine per week   Drug use: No     Allergies   Patient has no known allergies.   Review of Systems Review of Systems  HENT:  Positive for ear pain.   All other systems reviewed and are negative.    Physical Exam Triage Vital Signs ED Triage Vitals [03/01/23 0959]  Enc Vitals Group     BP      Pulse      Resp      Temp      Temp src      SpO2      Weight 180 lb (81.6 kg)  Height 5\' 4"  (1.626 m)     Head Circumference      Peak Flow      Pain Score 7     Pain Loc      Pain Edu?      Excl. in [place]?    No data found.  Updated Vital Signs BP 124/84 (BP Location: Left Arm)   Pulse 87   Temp 99 F (37.2 C) (Oral)   Resp 18   Ht 5\' 4"  (1.626 m)   Wt 180 lb (81.6 kg)   SpO2 100%   BMI 30.90 kg/m    Physical Exam Vitals and nursing note reviewed.  Constitutional:      Appearance: Normal appearance. She is obese.  HENT:     Head: Normocephalic and atraumatic.     Right Ear: External ear normal.     Left Ear: Tympanic membrane and external ear normal.     Ears:     Comments: Right TM: Red rimmed, erythematous, bulging; trace cerumen accumulation noted of bilateral EAC's    Mouth/Throat:     Mouth: Mucous membranes are moist.     Pharynx: Oropharynx is clear.  Eyes:     Extraocular Movements: Extraocular movements intact.     Conjunctiva/sclera: Conjunctivae normal.     Pupils: Pupils are equal, round, and reactive to light.  Cardiovascular:     Rate and Rhythm: Normal rate and regular rhythm.     Pulses: Normal pulses.     Heart sounds: Normal heart sounds.  Pulmonary:     Effort: Pulmonary effort is  normal.     Breath sounds: Normal breath sounds. No wheezing, rhonchi or rales.  Musculoskeletal:        General: Normal range of motion.     Cervical back: Normal range of motion and neck supple.  Skin:    General: Skin is warm and dry.  Neurological:     General: No focal deficit present.     Mental Status: She is alert and oriented to person, place, and time. Mental status is at baseline.      UC Treatments / Results  Labs (all labs ordered are listed, but only abnormal results are displayed) Labs Reviewed - No data to display  EKG   Radiology No results found.  Procedures Procedures (including critical care time)  Medications Ordered in UC Medications - No data to display  Initial Impression / Assessment and Plan / UC Course  I have reviewed the triage vital signs and the nursing notes.  Pertinent labs & imaging results that were available during my care of the patient were reviewed by me and considered in my medical decision making (see chart for details).     MDM: 1.  Acute right otitis media-Rx'd Augmentin 875 mg twice daily for 7 days; 2.  Acute otalgia, right-Rx'd prednisone 60 mg daily for 5 days. Instructed patient to take medications as directed with food to completion.  Advised patient to take Prednisone with first dose of Augmentin for the next 5 of 10 days.  Advised patient not to submerge head underwater for the next 10 days.  Encouraged increase daily water intake to 64 ounces per day while taking these medications.  Advised if symptoms worsen and/or unresolved please follow-up with PCP or here for further evaluation.  Patient discharged home, hemodynamically stable. Final Clinical Impressions(s) / UC Diagnoses   Final diagnoses:  Acute right otitis media  Acute otalgia, right     Discharge Instructions  Instructed patient to take medications as directed with food to completion.  Advised patient to take Prednisone with first dose of Augmentin for  the next 5 of 10 days.  Advised patient not to submerge head underwater for the next 10 days.  Encouraged increase daily water intake to 64 ounces per day while taking these medications.  Advised if symptoms worsen and/or unresolved please follow-up with PCP or here for further evaluation.     ED Prescriptions     Medication Sig Dispense Auth. Provider   amoxicillin-clavulanate (AUGMENTIN) 875-125 MG tablet Take 1 tablet by mouth 2 (two) times daily for 10 days. 20 tablet Eliezer Lofts, FNP   predniSONE (DELTASONE) 20 MG tablet Take 3 tabs PO daily x 5 days. 15 tablet Eliezer Lofts, FNP      PDMP not reviewed this encounter.   Eliezer Lofts, FNP 03/01/23 1020

## 2023-03-01 NOTE — Discharge Instructions (Addendum)
Instructed patient to take medications as directed with food to completion.  Advised patient to take Prednisone with first dose of Augmentin for the next 5 of 10 days.  Advised patient not to submerge head underwater for the next 10 days.  Encouraged increase daily water intake to 64 ounces per day while taking these medications.  Advised if symptoms worsen and/or unresolved please follow-up with PCP or here for further evaluation.

## 2023-03-01 NOTE — ED Triage Notes (Signed)
Pt states that she has right ear pain. X1 day

## 2023-03-02 ENCOUNTER — Telehealth: Payer: Self-pay | Admitting: Emergency Medicine

## 2023-03-02 NOTE — Telephone Encounter (Signed)
Patient states that she feels the medications are working.  She is still having pain but not as much.  Will follow up as needed.
